# Patient Record
Sex: Male | Born: 1985 | Race: Black or African American | Hispanic: No | Marital: Single | Smoking: Current every day smoker
Health system: Southern US, Community
[De-identification: ages and names within clinical notes are randomized; demographics above are authoritative.]

## PROBLEM LIST (undated history)

## (undated) DIAGNOSIS — J45909 Unspecified asthma, uncomplicated: Secondary | ICD-10-CM

## (undated) HISTORY — PX: HERNIA REPAIR: SHX51

---
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
## 2007-01-09 ENCOUNTER — Emergency Department (HOSPITAL_COMMUNITY): Admission: EM | Admit: 2007-01-09 | Discharge: 2007-01-09 | Payer: Self-pay | Admitting: Emergency Medicine

## 2008-01-09 ENCOUNTER — Emergency Department (HOSPITAL_COMMUNITY): Admission: EM | Admit: 2008-01-09 | Discharge: 2008-01-10 | Payer: Self-pay | Admitting: Emergency Medicine

## 2008-09-10 ENCOUNTER — Emergency Department (HOSPITAL_COMMUNITY): Admission: EM | Admit: 2008-09-10 | Discharge: 2008-09-10 | Payer: Self-pay | Admitting: Emergency Medicine

## 2009-01-26 ENCOUNTER — Emergency Department (HOSPITAL_COMMUNITY): Admission: EM | Admit: 2009-01-26 | Discharge: 2009-01-26 | Payer: Self-pay | Admitting: Emergency Medicine

## 2010-01-31 ENCOUNTER — Ambulatory Visit: Payer: Self-pay | Admitting: Internal Medicine

## 2010-01-31 DIAGNOSIS — J45909 Unspecified asthma, uncomplicated: Secondary | ICD-10-CM | POA: Insufficient documentation

## 2010-01-31 LAB — CONVERTED CEMR LAB
ALT: 12 units/L (ref 0–53)
AST: 22 units/L (ref 0–37)
Alkaline Phosphatase: 65 units/L (ref 39–117)
Basophils Relative: 0.9 % (ref 0.0–3.0)
Bilirubin, Direct: 0.1 mg/dL (ref 0.0–0.3)
Calcium: 9.3 mg/dL (ref 8.4–10.5)
Chloride: 98 meq/L (ref 96–112)
Creatinine, Ser: 1.1 mg/dL (ref 0.4–1.5)
Eosinophils Relative: 3 % (ref 0.0–5.0)
GFR calc non Af Amer: 113.33 mL/min (ref >60)
Lymphocytes Relative: 30.8 % (ref 12.0–46.0)
MCV: 99.2 fL (ref 78.0–100.0)
Monocytes Relative: 5.4 % (ref 3.0–12.0)
Neutrophils Relative %: 59.9 % (ref 43.0–77.0)
Nitrite: NEGATIVE
Platelets: 273 10*3/uL (ref 150.0–400.0)
RBC: 4.67 M/uL (ref 4.22–5.81)
Specific Gravity, Urine: 1.025 (ref 1.000–1.030)
TSH: 1.12 microintl units/mL (ref 0.35–5.50)
Total Bilirubin: 0.9 mg/dL (ref 0.3–1.2)
Total CHOL/HDL Ratio: 4
Total Protein: 7.8 g/dL (ref 6.0–8.3)
Urobilinogen, UA: 1 (ref 0.0–1.0)
WBC: 5.5 10*3/uL (ref 4.5–10.5)
pH: 7 (ref 5.0–8.0)

## 2010-02-02 LAB — CONVERTED CEMR LAB
Chlamydia, Swab/Urine, PCR: POSITIVE — AB
GC Probe Amp, Urine: POSITIVE — AB

## 2010-08-06 NOTE — Assessment & Plan Note (Signed)
Summary: last seen 2007/united hc/cd   Vital Signs:  Patient profile:   25 year old male Height:      65 inches Weight:      137.50 pounds BMI:     22.96 O2 Sat:      96 % on Room air Temp:     98.7 degrees F oral Pulse rate:   61 / minute BP sitting:   100 / 62  (left arm) Cuff size:   regular  Vitals Entered By: Zella Ball Ewing CMA (AAMA) (January 31, 2010 3:11 PM)  O2 Flow:  Room air  CC: STD test/RE   CC:  STD test/RE.  History of Present Illness: here to re-establish; is sexually active, but has had some swelling near the glans penis and forskin;  small pain, some tingling to urination and not sure about penile d/c;  no fever or other GU complaint such as scrotal sweling or pain. Pt denies CP, sob, doe, wheezing, orthopnea, pnd, worsening LE edema, palps, dizziness or syncope  Pt denies new neuro symptoms such as headache, facial or extremity weakness   Preventive Screening-Counseling & Management  Alcohol-Tobacco     Smoking Status: current      Drug Use:  yes.    Problems Prior to Update: None  Medications Prior to Update: 1)  None  Current Medications (verified): 1)  Proair Hfa 108 (90 Base) Mcg/act Aers (Albuterol Sulfate) .... 2 Puffs Qid As Needed 2)  Azithromycin 250 Mg Tabs (Azithromycin) .... 4 Tabs By Mouth X 1 Dosing 3)  Ciprofloxacin Hcl 500 Mg Tabs (Ciprofloxacin Hcl) .Marland Kitchen.. 1 By Mouth Two Times A Day For One Day Only  Allergies (verified): No Known Drug Allergies  Past History:  Family History: Last updated: 01/31/2010 mother with recent lap band, HTN  Social History: Last updated: 01/31/2010 Single no children work - unemployed just moved from [place] Current Smoker Alcohol use-yes Drug use-yes - small marijuana  Risk Factors: Smoking Status: current (01/31/2010)  Past Medical History: Asthma  Past Surgical History: Inguinal herniorrhaphy  Family History: Reviewed history and no changes required. mother with recent lap band,  HTN  Social History: Reviewed history and no changes required. Single no children work - unemployed just moved from [place] Current Smoker Alcohol use-yes Drug use-yes - small marijuanaSmoking Status:  current Drug Use:  yes  Review of Systems  The patient denies anorexia, fever, weight loss, weight gain, vision loss, decreased hearing, hoarseness, chest pain, syncope, dyspnea on exertion, peripheral edema, prolonged cough, headaches, hemoptysis, abdominal pain, melena, hematochezia, severe indigestion/heartburn, hematuria, genital sores, muscle weakness, suspicious skin lesions, transient blindness, difficulty walking, depression, unusual weight change, abnormal bleeding, enlarged lymph nodes, and angioedema.         all otherwise negative per pt -    Physical Exam  General:  alert and well-developed.   Head:  normocephalic and atraumatic.   Eyes:  vision grossly intact, pupils equal, and pupils round.   Ears:  R ear normal and L ear normal.   Nose:  no external deformity and no nasal discharge.   Mouth:  no gingival abnormalities and pharynx pink and moist.   Neck:  supple and no masses.   Lungs:  normal respiratory effort and normal breath sounds.   Heart:  normal rate and regular rhythm.   Abdomen:  soft, non-tender, and normal bowel sounds.   Genitalia:  Testes bilaterally descended without nodularity, tenderness or masses. No scrotal masses or lesions. No penis lesions but does have  mild off-white small penile d/c Msk:  no joint tenderness and no joint swelling.   Extremities:  no edema, no erythema  Neurologic:  cranial nerves II-XII intact and strength normal in all extremities.     Impression & Recommendations:  Problem # 1:  Preventive Health Care (ICD-V70.0)  Overall doing well, age appropriate education and counseling updated and referral for appropriate preventive services done unless declined, immunizations up to date or declined, diet counseling done if  overweight, urged to quit smoking if smokes , most recent labs reviewed and current ordered if appropriate, ecg reviewed or declined (interpretation per ECG scanned in the EMR if done); information regarding Medicare Prevention requirements given if appropriate; speciality referrals updated as appropriate   Orders: TLB-BMP (Basic Metabolic Panel-BMET) (80048-METABOL) TLB-CBC Platelet - w/Differential (85025-CBCD) TLB-Hepatic/Liver Function Pnl (80076-HEPATIC) TLB-Lipid Panel (80061-LIPID) TLB-TSH (Thyroid Stimulating Hormone) (84443-TSH) TLB-Udip ONLY (81003-UDIP)  Problem # 2:  ASTHMA (ICD-493.90)  His updated medication list for this problem includes:    Proair Hfa 108 (90 Base) Mcg/act Aers (Albuterol sulfate) .Marland Kitchen... 2 puffs qid as needede  excercise related - treat as above, f/u any worsening signs or symptoms   Problem # 3:  SEXUALLY TRANSMITTED DISEASE, EXPOSURE TO (ICD-V01.6)  for eval - has penile d/c today;  ok to tx empirically for gc/chlamydia   Orders: T-Chlamydia & GC Probe, Urine (87491/[address]) T-HIV-1 (Screen) (78295) T-Syphilis Test (RPR) [phone]) T-Herpes Simplex Type 2 (46962-95284)  Complete Medication List: 1)  Proair Hfa 108 (90 Base) Mcg/act Aers (Albuterol sulfate) .... 2 puffs qid as needed 2)  Azithromycin 250 Mg Tabs (Azithromycin) .... 4 tabs by mouth x 1 dosing 3)  Ciprofloxacin Hcl 500 Mg Tabs (Ciprofloxacin hcl) .Marland Kitchen.. 1 by mouth two times a day for one day only  Patient Instructions: 1)  Please take all new medications as prescribed 2)  Continue all previous medications as before this visit  3)  Please go to the Lab in the basement for your blood and/or urine tests today 4)  Please schedule a follow-up appointment as needed. Prescriptions: CIPROFLOXACIN HCL 500 MG TABS (CIPROFLOXACIN HCL) 1 by mouth two times a day for one day only  #2 x 0   Entered and Authorized by:   Corwin Levins MD   Signed by:   Corwin Levins MD on 01/31/2010   Method  used:   Print then Give to Patient   RxID:   [id number] AZITHROMYCIN 250 MG TABS (AZITHROMYCIN) 4 tabs by mouth x 1 dosing  #4 x 0   Entered and Authorized by:   Corwin Levins MD   Signed by:   Corwin Levins MD on 01/31/2010   Method used:   Print then Give to Patient   RxID:   [id number] PROAIR HFA 108 (90 BASE) MCG/ACT AERS (ALBUTEROL SULFATE) 2 puffs qid as needed  #1 x 11   Entered and Authorized by:   Corwin Levins MD   Signed by:   Corwin Levins MD on 01/31/2010   Method used:   Print then Give to Patient   RxID:   [id number]    Immunization History:  Tetanus/Td Immunization History:    Tetanus/Td:  historical (01/04/2009)

## 2010-10-17 LAB — COMPREHENSIVE METABOLIC PANEL
Albumin: 4.6 g/dL (ref 3.5–5.2)
Alkaline Phosphatase: 71 U/L (ref 39–117)
BUN: 10 mg/dL (ref 6–23)
CO2: 28 mEq/L (ref 19–32)
Chloride: 106 mEq/L (ref 96–112)
Creatinine, Ser: 1.08 mg/dL (ref 0.4–1.5)
GFR calc non Af Amer: >60 mL/min (ref >60)
Glucose, Bld: 89 mg/dL (ref 70–99)
Potassium: 3.8 mEq/L (ref 3.5–5.1)
Total Bilirubin: 1 mg/dL (ref 0.3–1.2)

## 2010-10-17 LAB — POCT I-STAT, CHEM 8
Glucose, Bld: 84 mg/dL (ref 70–99)
HCT: 52 % (ref 39.0–52.0)
Hemoglobin: 17.7 g/dL — ABNORMAL HIGH (ref 13.0–17.0)
Potassium: 3.9 mEq/L (ref 3.5–5.1)

## 2010-10-17 LAB — URINALYSIS, ROUTINE W REFLEX MICROSCOPIC
Glucose, UA: NEGATIVE mg/dL
Nitrite: NEGATIVE
Protein, ur: 30 mg/dL — AB
pH: 6.5 (ref 5.0–8.0)

## 2010-10-17 LAB — DIFFERENTIAL
Basophils Absolute: 0 10*3/uL (ref 0.0–0.1)
Basophils Relative: 0 % (ref 0–1)
Lymphocytes Relative: 23 % (ref 12–46)
Monocytes Absolute: 0.5 10*3/uL (ref 0.1–1.0)
Neutro Abs: 2.8 10*3/uL (ref 1.7–7.7)
Neutrophils Relative %: 62 % (ref 43–77)

## 2010-10-17 LAB — CBC
HCT: 49.2 % (ref 39.0–52.0)
Hemoglobin: 17 g/dL (ref 13.0–17.0)
MCV: 97.3 fL (ref 78.0–100.0)
WBC: 4.5 10*3/uL (ref 4.0–10.5)

## 2010-10-17 LAB — LIPASE, BLOOD: Lipase: 16 U/L (ref 11–59)

## 2010-10-17 LAB — URINE MICROSCOPIC-ADD ON

## 2010-10-17 LAB — URINE CULTURE

## 2011-01-01 ENCOUNTER — Inpatient Hospital Stay (INDEPENDENT_AMBULATORY_CARE_PROVIDER_SITE_OTHER)
Admission: RE | Admit: 2011-01-01 | Discharge: 2011-01-01 | Disposition: A | Discharge status: Home / Self Care | Payer: Self-pay | Source: Ambulatory Visit | Attending: Emergency Medicine | Admitting: Emergency Medicine

## 2011-01-01 ENCOUNTER — Ambulatory Visit (INDEPENDENT_AMBULATORY_CARE_PROVIDER_SITE_OTHER): Payer: Self-pay

## 2011-01-01 DIAGNOSIS — M79609 Pain in unspecified limb: Secondary | ICD-10-CM

## 2011-04-22 LAB — RAPID STREP SCREEN (MED CTR MEBANE ONLY): Streptococcus, Group A Screen (Direct): NEGATIVE

## 2012-03-28 ENCOUNTER — Encounter (HOSPITAL_COMMUNITY): Payer: Self-pay | Admitting: Emergency Medicine

## 2012-03-28 ENCOUNTER — Emergency Department (HOSPITAL_COMMUNITY)
Admission: EM | Admit: 2012-03-28 | Discharge: 2012-03-28 | Disposition: A | Discharge status: Home / Self Care | Payer: Self-pay | Attending: Emergency Medicine | Admitting: Emergency Medicine

## 2012-03-28 ENCOUNTER — Emergency Department (HOSPITAL_COMMUNITY): Payer: Self-pay

## 2012-03-28 DIAGNOSIS — M7520 Bicipital tendinitis, unspecified shoulder: Secondary | ICD-10-CM | POA: Insufficient documentation

## 2012-03-28 DIAGNOSIS — M25519 Pain in unspecified shoulder: Secondary | ICD-10-CM

## 2012-03-28 DIAGNOSIS — F17200 Nicotine dependence, unspecified, uncomplicated: Secondary | ICD-10-CM | POA: Insufficient documentation

## 2012-03-28 NOTE — ED Notes (Signed)
Ortho tech at bedside for sling application. 

## 2012-03-28 NOTE — ED Provider Notes (Signed)
History     CSN: [id number]  Arrival date & time 03/28/12  1338   First MD Initiated Contact with Patient 03/28/12 1434      Chief Complaint  Patient presents with  . Shoulder Pain    r/shoulder pain x days.     (Consider location/radiation/quality/duration/timing/severity/associated sxs/prior treatment) HPI Comments: 26 year old male presents the emergency department with worsening right shoulder pain for the past 2 days. He states 2 weeks ago his right shoulder began to hurt at work while lifting heavy boxes. The pain lasted about 3 days, went away, and returned 2 days ago. He describes the pain as a dull throb, increasing to a sharp 7/10 pain with movement. He has not tried any alleviating factors. He has never injured this shoulder before. Admits to tingling into his bicep. Denies numbness.  Patient is a 26 y.o. male presenting with shoulder pain. The history is provided by the patient.  Shoulder Pain Associated symptoms include arthralgias ( right shoulder pain) and weakness. Pertinent negatives include no fever or numbness.    History reviewed. No pertinent past medical history.  Past Surgical History  Procedure Date  . Hernia repair     History reviewed. No pertinent family history.  History  Substance Use Topics  . Smoking status: Current Every Day Smoker    Types: Cigarettes  . Smokeless tobacco: Not on file  . Alcohol Use: Yes      Review of Systems  Constitutional: Negative for fever and activity change.  Musculoskeletal: Positive for arthralgias ( right shoulder pain).  Skin: Negative for color change.  Neurological: Positive for weakness. Negative for numbness.    Allergies  Review of patient's allergies indicates no known allergies.  Home Medications  No current outpatient prescriptions on file.  BP 112/71  Pulse 60  Temp 98.1 F (36.7 C) (Oral)  Resp 18  SpO2 99%  Physical Exam  Constitutional: He is oriented to person, place, and time. He  appears well-developed and well-nourished. No distress.  HENT:  Head: Normocephalic and atraumatic.  Mouth/Throat: Oropharynx is clear and moist.  Eyes: Conjunctivae normal are normal.  Neck: Normal range of motion. Neck supple.  Cardiovascular: Normal rate, regular rhythm, normal heart sounds and intact distal pulses.   Pulmonary/Chest: Effort normal and breath sounds normal.  Musculoskeletal:       Right shoulder: He exhibits decreased range of motion (in all directions due to pain), tenderness ( biceps tendon insertion), bony tenderness (acromion process) and decreased strength (due to pain). He exhibits no swelling, no deformity and normal pulse.  Neurological: He is alert and oriented to person, place, and time. No sensory deficit.  Skin: Skin is warm and dry. No erythema.  Psychiatric: He has a normal mood and affect. His behavior is normal.    ED Course  Procedures (including critical care time)  Labs Reviewed - No data to display Dg Shoulder Right  03/28/2012  *RADIOLOGY REPORT*  Clinical Data: Shoulder pain, no known injury  RIGHT SHOULDER - 2+ VIEW  Comparison: None.  Findings: Three views of the right shoulder submitted.  No acute fracture or subluxation.  AC joint and glenohumeral joint are preserved.  IMPRESSION: No acute fracture or subluxation.   Original Report Authenticated By: Natasha Mead, M.D.      1. Shoulder pain   2. Bicipital tendinitis       MDM  26 year old male with right shoulder pain. Exam consistent with bicipital tendinitis. I will give him a shoulder sling and followup  with or so. Advised ibuprofen every 4-6 hours to avoid heavy lifting.        Trevor Mace, PA-C 03/28/12 1540

## 2012-03-28 NOTE — ED Notes (Signed)
Pt reports r/shoulder pain x 2 days.Marland Kitchen Hx of same. Denies trauma. Has been doing heavy lifting

## 2012-03-30 NOTE — ED Provider Notes (Signed)
Medical screening examination/treatment/procedure(s) were performed by non-physician practitioner and as supervising physician I was immediately available for consultation/collaboration.  Flint Melter, MD 03/30/12 2042

## 2013-02-09 ENCOUNTER — Emergency Department (HOSPITAL_COMMUNITY)
Admission: EM | Admit: 2013-02-09 | Discharge: 2013-02-09 | Disposition: A | Discharge status: Home / Self Care | Payer: No Typology Code available for payment source | Attending: Emergency Medicine | Admitting: Emergency Medicine

## 2013-02-09 ENCOUNTER — Emergency Department (HOSPITAL_COMMUNITY): Payer: No Typology Code available for payment source

## 2013-02-09 ENCOUNTER — Encounter (HOSPITAL_COMMUNITY): Payer: Self-pay | Admitting: Emergency Medicine

## 2013-02-09 DIAGNOSIS — X500XXA Overexertion from strenuous movement or load, initial encounter: Secondary | ICD-10-CM | POA: Insufficient documentation

## 2013-02-09 DIAGNOSIS — Y929 Unspecified place or not applicable: Secondary | ICD-10-CM | POA: Insufficient documentation

## 2013-02-09 DIAGNOSIS — F17200 Nicotine dependence, unspecified, uncomplicated: Secondary | ICD-10-CM | POA: Insufficient documentation

## 2013-02-09 DIAGNOSIS — R209 Unspecified disturbances of skin sensation: Secondary | ICD-10-CM | POA: Insufficient documentation

## 2013-02-09 DIAGNOSIS — Z79899 Other long term (current) drug therapy: Secondary | ICD-10-CM | POA: Insufficient documentation

## 2013-02-09 DIAGNOSIS — S6990XA Unspecified injury of unspecified wrist, hand and finger(s), initial encounter: Secondary | ICD-10-CM | POA: Insufficient documentation

## 2013-02-09 DIAGNOSIS — S59909A Unspecified injury of unspecified elbow, initial encounter: Secondary | ICD-10-CM | POA: Insufficient documentation

## 2013-02-09 DIAGNOSIS — Y9389 Activity, other specified: Secondary | ICD-10-CM | POA: Insufficient documentation

## 2013-02-09 DIAGNOSIS — M25531 Pain in right wrist: Secondary | ICD-10-CM

## 2013-02-09 MED ORDER — NAPROXEN 500 MG PO TABS: 500.0000 mg | ORAL_TABLET | Freq: Two times a day (BID) | ORAL | Status: DC

## 2013-02-09 NOTE — Progress Notes (Signed)
[facility] provided patient with a list of primary care resources.  °

## 2013-02-09 NOTE — Progress Notes (Signed)
During pt's 02/09/13 [facility] ED visit CM spoke with him to confirm no pcp. Pt stated he has a new job and coverage but does not have a card CM reviewed with pt the importance of contacting his HR Probation officer) staff to get his policy number and customer service number to assist him to re contact ED registration to apply this coverage to his visit today  [facility] ED CM spoke with pt on how to obtain an in network pcp with insurance coverage via the customer service number or web site

## 2013-02-09 NOTE — ED Provider Notes (Signed)
CSN: [id number]     Arrival date & time 02/09/13  1118 History     First MD Initiated Contact with Patient 02/09/13 1132     Chief Complaint  Patient presents with  . right hand injury    (Consider location/radiation/quality/duration/timing/severity/associated sxs/prior Treatment) The history is provided by the patient. No language interpreter was used.  Randall Dorsey is a 27 y/o M presenting to the ED with right hand pain that started on Friday, 02/04/2013, after patient pushed a steel door. Patient reported that he noticed swelling to the right hand immediately after the event, stated that the swelling has gone down some. Reported that he has pain whenever he closes his hand to create a fist, reported sharp pain that begins from the ulnar aspect of the right wrist to the right long finger. Patient reported that there is no pain when the hand is at rest. Stated that he has been applying ice, icy-hot ointment use, and been wrapping the hand. Reported that he has mild numbness to the right hand. Denied tingling, loss of sensation, ecchymosis, decreased grip, weakness.  PCP Dr. Oliver Dorsey  History reviewed. No pertinent past medical history. Past Surgical History  Procedure Laterality Date  . Hernia repair     No family history on file. History  Substance Use Topics  . Smoking status: Current Every Day Smoker    Types: Cigarettes  . Smokeless tobacco: Not on file  . Alcohol Use: Yes    Review of Systems  Constitutional: Negative for fever and chills.  Respiratory: Negative for chest tightness and shortness of breath.   Cardiovascular: Negative for chest pain.  Musculoskeletal: Positive for arthralgias (right hand).  Neurological: Positive for numbness (mild). Negative for dizziness and weakness.  All other systems reviewed and are negative.    Allergies  Review of patient's allergies indicates no known allergies.  Home Medications   Current Outpatient Rx  Name  Route  Sig   Dispense  Refill  . naproxen (NAPROSYN) 500 MG tablet   Oral   Take 1 tablet (500 mg total) by mouth 2 (two) times daily.   30 tablet   0    BP 128/80  Pulse 53  Temp(Src) 97.9 F (36.6 C) (Oral)  Resp 17  SpO2 100% Physical Exam  Nursing note and vitals reviewed. Constitutional: He is oriented to person, place, and time. He appears well-developed and well-nourished. No distress.  HENT:  Head: Normocephalic and atraumatic.  Neck: Normal range of motion. Neck supple.  Cardiovascular: Normal rate, regular rhythm and normal heart sounds.  Exam reveals no friction rub.   No murmur heard. Pulses:      Radial pulses are 2+ on the right side, and 2+ on the left side.  Pulmonary/Chest: Effort normal and breath sounds normal. No respiratory distress. He has no wheezes. He has no rales.  Musculoskeletal: Normal range of motion.  Mild swelling noted to the the thenar and hypothenar region of the right hand Negative erythema, inflammation, ecchymosis, warmth, lesions, abrasions, punctures noted  Negative snuffbox tenderness to the right hand Discomfort upon palpation to the right long finger and right wrist circumferential  Full flexion, extension, inversion, eversion, supination and pronation of right wrist Full adduction and abduction of the right digits Patient able to make a fist   Neurological: He is alert and oriented to person, place, and time. He exhibits normal muscle tone. Coordination normal.  Strength 5+/5+ to right wrist and digits Sensation intact to RUE and  LUE with differentiation tot sharp and dull touch  Skin: Skin is warm and dry. No rash noted. He is not diaphoretic. No erythema.  Psychiatric: He has a normal mood and affect. His behavior is normal. Thought content normal.    ED Course   Procedures (including critical care time)  Labs Reviewed - No data to display Dg Wrist Complete Right  02/09/2013   *RADIOLOGY REPORT*  Clinical Data: Hand injury complaining  of wrist pain.  RIGHT WRIST - COMPLETE 3+ VIEW  Comparison: No priors.  Findings: Four views of the right wrist demonstrate no acute displaced fracture, subluxation, dislocation, joint or soft tissue abnormality.  IMPRESSION: 1.  No acute radiographic abnormality of the right wrist.   Original Report Authenticated By: Trudie Reed, M.D.   Dg Hand Complete Right  02/09/2013   *RADIOLOGY REPORT*  Clinical Data: Punched a door with hand and wrist pain diffusely  RIGHT HAND - COMPLETE 3+ VIEW  Comparison: None.  Findings: No acute fracture is seen.  Radiocarpal joint space appears normal and the carpal bones are in normal position.  MCP, PIP, and DIP joints appear normal.  IMPRESSION: Negative.   Original Report Authenticated By: Dwyane Dee, M.D.   1. Right wrist pain     MDM  Patient presenting to the ED with right hand pain after punching a steel door on 02/04/2013. Mild swelling noted to the right thenar and hypothenar region. Full ROM to the right wrist and digits. Sensation intact. Strength intact. Pulses palpable. Negative neurological deficits noted. Negative deformities noted. Imaging negative for acute fractures or dislocations.  Patient placed in wrist splint. Negative signs of septic joint. Negative fracture. Suspicion to be possible bone contusion secondary to trauma. Patient stable, afebrile. Discharged patient. Discharged with anti-inflammatories. Discussed with patient to rest, ice, elevate. Referred to PCP and orthopedics. Discussed with patient to continue to monitor symptoms and if symptoms are to worsen or change to report back to the ED - strict return instructions given.  Patient agreed to plan of care, understood, all questions answered.   Raymon Mutton, PA-C 02/09/13 2356

## 2013-02-09 NOTE — ED Notes (Signed)
Pt states he got mad on Friday and to his anger out on the wrong object. Pt right hand is swollen, painful and cant grip his screwdriver that he has to use while at work.

## 2013-02-11 NOTE — ED Provider Notes (Signed)
History/physical exam/procedure(s) were performed by non-physician practitioner and as supervising physician I was immediately available for consultation/collaboration. I have reviewed all notes and am in agreement with care and plan.   Hilario Quarry, MD 02/11/13 2156

## 2014-01-30 ENCOUNTER — Encounter (HOSPITAL_COMMUNITY): Payer: Self-pay | Admitting: Emergency Medicine

## 2014-01-30 ENCOUNTER — Emergency Department (HOSPITAL_COMMUNITY)
Admission: EM | Admit: 2014-01-30 | Discharge: 2014-01-31 | Disposition: A | Discharge status: Home / Self Care | Payer: No Typology Code available for payment source | Attending: Emergency Medicine | Admitting: Emergency Medicine

## 2014-01-30 DIAGNOSIS — F17200 Nicotine dependence, unspecified, uncomplicated: Secondary | ICD-10-CM | POA: Insufficient documentation

## 2014-01-30 DIAGNOSIS — Y9389 Activity, other specified: Secondary | ICD-10-CM | POA: Insufficient documentation

## 2014-01-30 DIAGNOSIS — Y9289 Other specified places as the place of occurrence of the external cause: Secondary | ICD-10-CM | POA: Insufficient documentation

## 2014-01-30 DIAGNOSIS — S61209A Unspecified open wound of unspecified finger without damage to nail, initial encounter: Secondary | ICD-10-CM | POA: Insufficient documentation

## 2014-01-30 DIAGNOSIS — IMO0002 Reserved for concepts with insufficient information to code with codable children: Secondary | ICD-10-CM | POA: Insufficient documentation

## 2014-01-30 DIAGNOSIS — S61219A Laceration without foreign body of unspecified finger without damage to nail, initial encounter: Secondary | ICD-10-CM

## 2014-01-30 DIAGNOSIS — S6990XA Unspecified injury of unspecified wrist, hand and finger(s), initial encounter: Secondary | ICD-10-CM | POA: Insufficient documentation

## 2014-01-30 NOTE — ED Notes (Signed)
Pt c/o lac to knuckle on  R index finger. Pt states he hit his finger on the steering wheel. Pt states his tetanus shot is up to date.

## 2014-01-30 NOTE — ED Notes (Signed)
Patient called to triage, no answer 

## 2014-01-31 ENCOUNTER — Emergency Department (HOSPITAL_COMMUNITY): Payer: No Typology Code available for payment source

## 2014-01-31 NOTE — ED Provider Notes (Signed)
CSN: [id number]     Arrival date & time 01/30/14  2256 History   First MD Initiated Contact with Patient 01/30/14 2354     Chief Complaint  Patient presents with  . Hand Injury     (Consider location/radiation/quality/duration/timing/severity/associated sxs/prior Treatment) HPI Comments: Patient presenting with right hand pain.  He reports that he hit his steering wheel in anger just prior to arrival.  He sustained a small laceration to the PIP of the right index finger at that time.  He reports that he has not noticed any swelling of the area.  Bleeding is controlled.  He is able to move the finger, but has some increased pain with movement and palpation.  He denies numbness or tingling.  He has not taken anything for pain prior to arrival.  He reports that his Tetanus is UTD.  The history is provided by the patient.    History reviewed. No pertinent past medical history. Past Surgical History  Procedure Laterality Date  . Hernia repair     No family history on file. History  Substance Use Topics  . Smoking status: Current Every Day Smoker    Types: Cigarettes  . Smokeless tobacco: Not on file  . Alcohol Use: Yes    Review of Systems  Musculoskeletal:       Hand pain  Skin: Positive for wound.      Allergies  Review of patient's allergies indicates no known allergies.  Home Medications   Prior to Admission medications   Not on File   BP 122/85  Pulse 49  Temp(Src) 98.2 F (36.8 C) (Oral)  Resp 16  SpO2 100% Physical Exam  Nursing note and vitals reviewed. Constitutional: He appears well-developed and well-nourished.  HENT:  Head: Normocephalic and atraumatic.  Mouth/Throat: Oropharynx is clear and moist.  Cardiovascular: Normal rate, regular rhythm and normal heart sounds.   Pulses:      Radial pulses are 2+ on the right side, and 2+ on the left side.  Pulmonary/Chest: Effort normal and breath sounds normal.  Musculoskeletal: Normal range of motion.    Right wrist: He exhibits normal range of motion, no bony tenderness and no swelling.  Patient with full ROM of all fingers of the right hand, but increased pain with ROM of the right index finger at the MCP and the PIP.  No swelling or bruising of the hand.  Patient able to make fist.    Neurological: He is alert.  Distal sensation of all fingers of the right hand intact  Skin: Skin is warm and dry.  Small superficial non gaping 1 cm linear laceration of the dorsal aspect of the PIP of the right index finger.    Psychiatric: He has a normal mood and affect.    ED Course  Procedures (including critical care time) Labs Review Labs Reviewed - No data to display  Imaging Review Dg Hand Complete Right  01/31/2014   CLINICAL DATA:  Right hand pain after injury.  EXAM: RIGHT HAND - COMPLETE 3+ VIEW  COMPARISON:  02/09/2013  FINDINGS: There is no evidence of fracture or dislocation. There is no evidence of arthropathy or other focal bone abnormality. Soft tissues are unremarkable.  IMPRESSION: Negative.   Electronically Signed   By: Burman Nieves M.D.   On: 01/31/2014 00:45     EKG Interpretation None     LACERATION REPAIR Performed by: Santiago Glad Authorized by: Santiago Glad Consent: Verbal consent obtained. Risks and benefits: risks, benefits and alternatives  were discussed Consent given by: patient Patient identity confirmed: provided demographic data Prepped and Draped in normal sterile fashion Wound explored  Laceration Location: right index finger over the PIP  Laceration Length: 1 cm  No Foreign Bodies seen or palpated  Anesthetic total: None  Irrigation method: syringe Amount of cleaning: standard  Skin closure: Dermabond  Technique: Dermabond  Patient tolerance: Patient tolerated the procedure well with no immediate complications.  MDM   Final diagnoses:  None   Patient presenting with right hand pain after hitting his steering wheel in anger.   Xray negative.  Patient is neurovascularly intact.  He did sustain a small superficial laceration over the PIP.  Laceration repaired with Dermabond.  Patient is stable for discharge.  Return precautions given.      Santiago GladHeather Zyion Leidner, PA-C 02/01/14 1411

## 2014-02-08 NOTE — ED Provider Notes (Signed)
Medical screening examination/treatment/procedure(s) were performed by non-physician practitioner and as supervising physician I was immediately available for consultation/collaboration.   EKG Interpretation None       Derwood KaplanAnkit Zohair Epp, MD 02/08/14 [phone]

## 2015-02-04 ENCOUNTER — Encounter (HOSPITAL_COMMUNITY): Payer: Self-pay | Admitting: *Deleted

## 2015-02-04 ENCOUNTER — Emergency Department (HOSPITAL_COMMUNITY)
Admission: EM | Admit: 2015-02-04 | Discharge: 2015-02-04 | Disposition: A | Discharge status: Home / Self Care | Payer: Self-pay | Attending: Emergency Medicine | Admitting: Emergency Medicine

## 2015-02-04 DIAGNOSIS — M545 Low back pain, unspecified: Secondary | ICD-10-CM

## 2015-02-04 DIAGNOSIS — Z720 Tobacco use: Secondary | ICD-10-CM | POA: Insufficient documentation

## 2015-02-04 MED ORDER — CYCLOBENZAPRINE HCL 10 MG PO TABS: 10.0000 mg | ORAL_TABLET | Freq: Two times a day (BID) | ORAL | Status: DC | PRN

## 2015-02-04 MED ORDER — HYDROCODONE-ACETAMINOPHEN 5-325 MG PO TABS: 1.0000 | ORAL_TABLET | Freq: Four times a day (QID) | ORAL | Status: DC | PRN

## 2015-02-04 NOTE — Discharge Instructions (Signed)
Back Exercises Back exercises help treat and prevent back injuries. The goal of back exercises is to increase the strength of your abdominal and back muscles and the flexibility of your back. These exercises should be started when you no longer have back pain. Back exercises include:  Pelvic Tilt. Lie on your back with your knees bent. Tilt your pelvis until the lower part of your back is against the floor. Hold this position 5 to 10 sec and repeat 5 to 10 times.  Knee to Chest. Pull first 1 knee up against your chest and hold for 20 to 30 seconds, repeat this with the other knee, and then both knees. This may be done with the other leg straight or bent, whichever feels better.  Sit-Ups or Curl-Ups. Bend your knees 90 degrees. Start with tilting your pelvis, and do a partial, slow sit-up, lifting your trunk only 30 to 45 degrees off the floor. Take at least 2 to 3 seconds for each sit-up. Do not do sit-ups with your knees out straight. If partial sit-ups are difficult, simply do the above but with only tightening your abdominal muscles and holding it as directed.  Hip-Lift. Lie on your back with your knees flexed 90 degrees. Push down with your feet and shoulders as you raise your hips a couple inches off the floor; hold for 10 seconds, repeat 5 to 10 times.  Back arches. Lie on your stomach, propping yourself up on bent elbows. Slowly press on your hands, causing an arch in your low back. Repeat 3 to 5 times. Any initial stiffness and discomfort should lessen with repetition over time.  Shoulder-Lifts. Lie face down with arms beside your body. Keep hips and torso pressed to floor as you slowly lift your head and shoulders off the floor. Do not overdo your exercises, especially in the beginning. Exercises may cause you some mild back discomfort which lasts for a few minutes; however, if the pain is more severe, or lasts for more than 15 minutes, do not continue exercises until you see your caregiver.  Improvement with exercise therapy for back problems is slow.  See your caregivers for assistance with developing a proper back exercise program. Document Released: 07/31/2004 Document Revised: 09/15/2011 Document Reviewed: 04/24/2011 [facility] Patient Information 2015 [place], [place]. This information is not intended to replace advice given to you by your health care provider. Make sure you discuss any questions you have with your health care provider.  Please use medication only as directed, monitor for new or worsening signs or symptoms return if any present. Please follow-up your primary care provider for further evaluation and management.

## 2015-02-04 NOTE — ED Notes (Signed)
Pt reports back pain started on Tuesday 01-30-15. Pt has been taking tylenol for pain with no relief.

## 2015-02-04 NOTE — ED Provider Notes (Signed)
CSN: [id number]     Arrival date & time 02/04/15  1019 History  This chart was scribed for non-physician practitioner Jeralyn Bennett, PA-C working with Elwin Mocha, MD by Lyndel Safe, ED Scribe. This patient was seen in room TR11C/TR11C and the patient's care was started at 10:54 AM.       Chief Complaint  Patient presents with  . Back Pain   The history is provided by the patient. No language interpreter was used.   HPI Comments: Randall Dorsey is a 29 y.o. male who presents to the Emergency Department complaining of gradually worsening, constant, sharp lower back pain with onset 5 days. He reports the pain is more prominent on the lower right side. Pt reports he felt his back tighten 6 days ago after he tried stretching his lower back with onset of the pain after waking up the next morning from his stretch. His pain is exacerbated with ambulation, bending, or sitting down. The pt has tried applying heating pads and taking tylenol with no relief. 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 Denies any injury or heavy lifting that is attributable to the pain, numbness or tingling down his legs, decreased strength in his legs, fevers, numbness or tingling in his groin region, bladder or bowel incontinence, or use of IV drugs. Pt is not followed by a PCP but states he recently re-applied for insurance through his employer and can establish care following insurance coverage.   History reviewed. No pertinent past medical history. Past Surgical History  Procedure Laterality Date  . Hernia repair     History reviewed. No pertinent family history. History  Substance Use Topics  . Smoking status: Current Every Day Smoker    Types: Cigarettes  . Smokeless tobacco: Not on file  . Alcohol Use: Yes    Review of Systems  All other systems reviewed and are negative.  Allergies  Review of patient's allergies indicates no known allergies.  Home  Medications   Prior to Admission medications   Medication Sig Start Date End Date Taking? Authorizing Provider  cyclobenzaprine (FLEXERIL) 10 MG tablet Take 1 tablet (10 mg total) by mouth 2 (two) times daily as needed for muscle spasms. 02/04/15   Eyvonne Mechanic, PA-C  HYDROcodone-acetaminophen (NORCO/VICODIN) 5-325 MG per tablet Take 1 tablet by mouth every 6 (six) hours as needed. 02/04/15   Tayvon Culley, PA-C   BP 131/73 mmHg  Pulse 83  Temp(Src) 97.6 F (36.4 C) (Oral)  Resp 12  Ht 5\' 5"  (1.651 m)  Wt 145 lb (65.772 kg)  BMI 24.13 kg/m2  SpO2 100% Physical Exam  Constitutional: He appears well-developed and well-nourished. No distress.  HENT:  Head: Normocephalic.  Eyes: Conjunctivae are normal. Right eye exhibits no discharge. Left eye exhibits no discharge. No scleral icterus.  Neck: No JVD present.  Pulmonary/Chest: Effort normal. No respiratory distress.  Neurological: He is alert. Coordination normal.  Skin: Skin is warm. No rash noted. No erythema. No pallor.  Psychiatric: He has a normal mood and affect. His behavior is normal.  Nursing note and vitals reviewed.   ED Course  Procedures  Labs Review Labs Reviewed - No data to display  Imaging Review No results found.   EKG Interpretation None      MDM   Final diagnoses:  Right-sided low back pain without sciatica    Labs: N/A  Imaging: N/A  Consults:N/A  Therapeutics: N/A  Discharge Meds: hydrocodone-acetaminophen 5-325mg  and  flexeril    Assessment/Plan: Patient presents with back pain, no red flags. Likely muscular in nature. Advised pt to take ibuprofen and apply ice to the affected area for the next couple of days and then apply heat. Will prescribe muscle relaxant and short course of pain medication. Provided pt with stretching exercises to perform. Pt acknowledges and agrees to plan. Trichomoniasis return precautions given, follow-up with primary care provider for further evaluation and  management.   I personally performed the services described in this documentation, which was scribed in my presence. The recorded information has been reviewed and is accurate.   Eyvonne Mechanic, PA-C 02/04/15 1717  Elwin Mocha, MD 02/05/15 [phone]

## 2015-02-04 NOTE — ED Notes (Signed)
Declined W/C at D/C and was escorted to lobby by RN. 

## 2015-07-23 ENCOUNTER — Emergency Department (HOSPITAL_COMMUNITY)
Admission: EM | Admit: 2015-07-23 | Discharge: 2015-07-23 | Disposition: A | Discharge status: Home / Self Care | Payer: Self-pay | Attending: Emergency Medicine | Admitting: Emergency Medicine

## 2015-07-23 ENCOUNTER — Encounter (HOSPITAL_COMMUNITY): Payer: Self-pay | Admitting: *Deleted

## 2015-07-23 DIAGNOSIS — L02411 Cutaneous abscess of right axilla: Secondary | ICD-10-CM | POA: Insufficient documentation

## 2015-07-23 DIAGNOSIS — F17210 Nicotine dependence, cigarettes, uncomplicated: Secondary | ICD-10-CM | POA: Insufficient documentation

## 2015-07-23 DIAGNOSIS — L0291 Cutaneous abscess, unspecified: Secondary | ICD-10-CM

## 2015-07-23 MED ORDER — HYDROCODONE-ACETAMINOPHEN 5-325 MG PO TABS
1.0000 | ORAL_TABLET | Freq: Once | ORAL | Status: AC
  Administered 2015-07-23: 1 via ORAL
  Filled 2015-07-23: qty 1

## 2015-07-23 MED ORDER — SULFAMETHOXAZOLE-TRIMETHOPRIM 800-160 MG PO TABS
1.0000 | ORAL_TABLET | Freq: Once | ORAL | Status: AC
  Administered 2015-07-23: 1 via ORAL
  Filled 2015-07-23: qty 1

## 2015-07-23 MED ORDER — SULFAMETHOXAZOLE-TRIMETHOPRIM 800-160 MG PO TABS: 1.0000 | ORAL_TABLET | Freq: Two times a day (BID) | ORAL | Status: AC

## 2015-07-23 MED ORDER — HYDROCODONE-ACETAMINOPHEN 5-325 MG PO TABS: ORAL_TABLET | ORAL | Status: DC

## 2015-07-23 MED ORDER — LIDOCAINE HCL (PF) 1 % IJ SOLN
5.0000 mL | Freq: Once | INTRAMUSCULAR | Status: AC
  Administered 2015-07-23: 5 mL via INTRADERMAL
  Filled 2015-07-23: qty 5

## 2015-07-23 NOTE — Discharge Instructions (Signed)
Take vicodin for breakthrough pain, do not drink alcohol, drive, care for children or do other critical tasks while taking vicodin.   Take your antibiotics as directed and to completion. You should never have any leftover antibiotics! Push fluids and stay well hydrated.    If you develop fever, nausea, vomiting, worsening pain return to the emergency room immediately for recheck.  Do not hesitate to return to the emergency room for any new, worsening or concerning symptoms.  Please obtain primary care using resource guide below. Let them know that you were seen in the emergency room and that they will need to obtain records for further outpatient management.   Incision and Drainage Incision and drainage is a procedure in which a sac-like structure (cystic structure) is opened and drained. The area to be drained usually contains material such as pus, fluid, or blood.  LET YOUR CAREGIVER KNOW ABOUT:   Allergies to medicine.  Medicines taken, including vitamins, herbs, eyedrops, over-the-counter medicines, and creams.  Use of steroids (by mouth or creams).  Previous problems with anesthetics or numbing medicines.  History of bleeding problems or blood clots.  Previous surgery.  Other health problems, including diabetes and kidney problems.  Possibility of pregnancy, if this applies. RISKS AND COMPLICATIONS  Pain.  Bleeding.  Scarring.  Infection. BEFORE THE PROCEDURE  You may need to have an ultrasound or other imaging tests to see how large or deep your cystic structure is. Blood tests may also be used to determine if you have an infection or how severe the infection is. You may need to have a tetanus shot. PROCEDURE  The affected area is cleaned with a cleaning fluid. The cyst area will then be numbed with a medicine (local anesthetic). A small incision will be made in the cystic structure. A syringe or catheter may be used to drain the contents of the cystic structure, or  the contents may be squeezed out. The area will then be flushed with a cleansing solution. After cleansing the area, it is often gently packed with a gauze or another wound dressing. Once it is packed, it will be covered with gauze and tape or some other type of wound dressing. AFTER THE PROCEDURE   Often, you will be allowed to go home right after the procedure.  You may be given antibiotic medicine to prevent or heal an infection.  If the area was packed with gauze or some other wound dressing, you will likely need to come back in 1 to 2 days to get it removed.  The area should heal in about 14 days.   This information is not intended to replace advice given to you by your health care provider. Make sure you discuss any questions you have with your health care provider.   Document Released: 12/17/2000 Document Revised: 12/23/2011 Document Reviewed: 08/18/2011 Elsevier Interactive Patient Education [employer]   Emergency Department Resource Guide 1) Find a Doctor and Pay Out of Pocket Although you won't have to find out who is covered by your insurance plan, it is a good idea to ask around and get recommendations. You will then need to call the office and see if the doctor you have chosen will accept you as a new patient and what types of options they offer for patients who are self-pay. Some doctors offer discounts or will set up payment plans for their patients who do not have insurance, but you will need to ask so you aren't surprised when you get  to your appointment.  2) Contact Your Local Health Department Not all health departments have doctors that can see patients for sick visits, but many do, so it is worth a call to see if yours does. If you don't know where your local health department is, you can check in your phone book. The CDC also has a tool to help you locate your state's health department, and many state websites also have listings of all of their local health  departments.  3) Find a Walk-in Clinic If your illness is not likely to be very severe or complicated, you may want to try a walk in clinic. These are popping up all over the country in pharmacies, drugstores, and shopping centers. They're usually staffed by nurse practitioners or physician assistants that have been trained to treat common illnesses and complaints. They're usually fairly quick and inexpensive. However, if you have serious medical issues or chronic medical problems, these are probably not your best option.  No Primary Care Doctor: - Call Health Connect at  [phone] - they can help you locate a primary care doctor that  accepts your insurance, provides certain services, etc. - Physician Referral Service- [phone]  Chronic Pain Problems: Organization         Address  Phone   Notes  [facility]  [phone] Patients need to be referred by their primary care doctor.   Medication Assistance: Organization         Address  Phone   Notes  [facility] Medication [facility] [address] [place] [address] [place], [place] [address] [phone]) [phone] --Must be a resident of [facility] [place] -- Must have NO insurance coverage whatsoever (no Medicaid/ Medicare, etc.) -- The pt. MUST have a primary care doctor that directs their care regularly and follows them in the community   MedAssist  [phone]   [employer]  [phone]    Agencies that provide inexpensive medical care: Organization         Address  Phone   Notes  [facility]  [phone]   [facility]    [phone]   [facility] [address] [place], [place] [address] [phone]   [facility] [address], [place] [phone]) [phone]   Planned Parenthood    [phone](336) [phone]   [facility]    [phone]   [facility]  [address], [place] Phone:  [phone](336)  [phone], Fax:  [phone](336) [phone] Hours of Operation:  9 am - 6 pm, M-F.  Also accepts Medicaid/Medicare and self-pay.  [facility]  [address], [place] Phone: [phone](336) [phone], Fax: [phone]) [phone]. Hours of Operation:  8:30 am - 5:30 pm, M-F.  Also accepts Medicaid and self-pay.  [facility] [place] [address], [place] Phone: [phone]) [phone]   [facility] [address], [place], [place] [phone], Ext. [phone] Mondays & Thursdays: 7-9 AM.  First 15 patients are seen on a first come, first serve basis.    Medicaid-accepting [facility] Providers:  Organization         Address  Phone   Notes  [facility] [address], [place] [phone] Also accepts self-pay patients.  [facility] [address], [place]  [phone]) [phone]   [facility] [phone]  [address], [place] [phone]) [phone]   [facility] [address] [place] [address], [place] [phone]) [phone]   Renaye RakersVeita Bland [address], [place]   [phone] Only accepts [place] Access [place] patients after they have their name applied to their card.   Self-Pay (no insurance) in [facility] [place]:  Organization         Address  Phone   Notes  Sickle Cell Patients, [facility] [address], [place] [phone]) [phone]   [facility] [address], [place] [phone]   [facility] [place]  [address], [place] [phone]) [phone]   [facility]/Dr. Osei-Bonsu  [address] [place] [address], [place] or [phone], [place] [phone] Phone number for both [place] and [place] locations is the same.  [facility] [address], [place] [phone]) [phone]   [facility] [place] [address] [place] [address], [place] or [address] [phone](336)  [phone]   [facility] [address], [place] [phone], phone; [phone]) [phone], fax Sees patients 1st and 3rd Saturday of every month.  Must not qualify for public or private insurance (i.e. Medicaid, Medicare, [place] Health Choice, Veterans' Benefits)  Household income should be no more than 200% of the poverty level The clinic cannot treat you if you are pregnant or think you are pregnant  Sexually transmitted diseases are not treated at the clinic.    Dental Care: Organization         Address  Phone  Notes  [facility] of [facility] [address], [place] [phone] Accepts children up to age 28 who are enrolled in [place] or [place] Health Choice; pregnant women with a Medicaid card; and children who have applied for Medicaid or [place] Health Choice, but were declined, whose parents can pay a reduced fee at time of service.  [facility] Department of [facility] [place]  [address], [place] [phone] Accepts children up to age 28 who are enrolled in [place] or [place] Health Choice; pregnant women with a Medicaid card; and children who have applied for Medicaid or [place] Health Choice, but were declined, whose parents can pay a reduced fee at time of service.  [facility]  [address], [place] [phone] Patients are seen by appointment only. Walk-ins are not accepted. [facility] will see patients 28 years of age and older. Monday - Tuesday (8am-5pm) Most Wednesdays (8:30-5pm) $30 per visit, cash only  [facility]  [address], [facility] [phone] Patients are seen by appointment only. Walk-ins are not accepted. [facility] will see patients 28 years of age and older. One Wednesday Evening (Monthly: Volunteer Based).  $30 per visit, cash only  [facility]  [phone] for adults;  Children under age 594, call Graduate Pediatric Dentistry at [phone]. Children aged 454-14, please call [phone] to request a pediatric application.  Dental services are provided in all areas of dental care including fillings, crowns and bridges, complete and partial dentures, implants, gum treatment, root canals, and extractions. Preventive care is also provided. Treatment is provided to both adults and children. Patients are selected via a lottery and there is often a waiting list.   [facility]  [address], [place] Otto  [phone] [email or web address]   [facility] [address] [place], [place] [phone], Ext. [phone] Second and Fourth Thursday of each month, opens at 6:30 AM; Clinic ends at 9 AM.  Patients are seen on a first-come first-served basis, and a limited number are seen during each clinic.   [facility]  [address] [place], [place] [phone]   Eligibility Requirements You must have lived in [place], [place], or [place] counties for at least the last three months.   You cannot be eligible for state or federal sponsored [place], including [employer], [place], or [employer].   You generally cannot be eligible for healthcare insurance through your employer.    How to apply: Eligibility screenings are held every Tuesday and Wednesday afternoon from 1:00 pm until 4:00 pm. You do not need an appointment for the interview!  [facility] [address], [place], [place] [phone]   [facility] Health Department  [phone]   [facility] Health Department  [phone]   [facility] Health Department  [phone]    Behavioral Health Resources in the Community: Intensive Outpatient Programs Organization         Address  Phone  Notes  [facility] [address] [place], [place] [phone]   [facility] [address] [place], [place] [phone]   [facility] [address], [place], [place]  [phone]   [facility] Mental Health [address],  [place], [place] [phone] or [phone]   Substance Abuse Resources Organization         Address  Phone  Notes  [facility] Drug Services  [phone]   [facility]  [phone]   [facility]  [phone]   Floydene Flock  [phone]   Residential & Outpatient Substance Abuse Program  [phone]   Psychological Services Organization         Address  Phone  Notes  [facility] Behavioral Health  [phone]   [facility]  [phone]   [facility] Mental Health [address], [place] [phone] or [phone]    Mobile Crisis Teams Organization         Address  Phone  Notes  [facility], Mobile Crisis Care Unit  [phone]   [facility]  [address] [place], [place] [phone]   Doristine Locks [address] [place] [phone]    Self-Help/Support Groups Organization         Address  Phone             Notes  [facility] [place] - variety of support groups  [phone]- [phone] Call for more information  Narcotics Anonymous (NA), [facility] [address], [employer] [place]  2 meetings at this location   Statistician         Address  Phone  Notes  [facility] [address],    [place]  [phone]   [facility]  [address] [place] [address], [place], [place] [phone]   [facility] [address] [place], [place] [phone] Admissions: 8am-3pm M-F  [facility] [address],    [place], [place] [phone]   [facility] [address] [place], [place] [phone]   [facility] [address],  [place], [place]  [phone]   [facility] - Intensive  Outpatient [address], [place], [place] [phone]   [facility] ([facility] [address] [place].,  [place], [place] [phone] or [phone]   [facility] (RTS) [address], [place], [place] [phone] Accepts Medicaid  [facility] [address]  [place] [phone] Substance Abuse/Addiction Treatment   [facility] Organization         Address  Phone  Notes  [facility]  [phone]   Angie Fava, PhD [address]vin Kn[address] [place], [place]   [phone] or [phone]   [facility] Behavioral   [address] [place], [place] [phone]   [facility] [address], [place], [place] [phone] Insurance/Medicaid/sponsorship through [facility] and Families [address]                                    [place], [place] [phone] Therapy/tele-psych/case  [facility] [address] [place], [place] [phone]    Dr. Lolly Mustache  [phone]   [facility] [place]  [employer] [facility] Dept. 1) [address] [place] 2) [address] [place] 3)  [address], [place] [phone]  [phone]   [facility] Child Abuse Hotline [phone] or [phone] (After Hours)

## 2015-07-23 NOTE — ED Notes (Signed)
SEE PA Assessment 

## 2015-07-23 NOTE — ED Notes (Signed)
Declined W/C at D/C and was escorted to lobby by RN. 

## 2015-07-23 NOTE — ED Provider Notes (Signed)
CSN: [id number]     Arrival date & time 07/23/15  1440 History  By signing my name below, I, Elon SpannerGarrett Cook, attest that this documentation has been prepared under the direction and in the presence of [employer] Mutualicole Maansi Wike, PA-C. Electronically Signed: Elon SpannerGarrett Cook ED Scribe. 07/23/2015. 2:48 PM.    Chief Complaint  Patient presents with  . Skin Problem   The history is provided by the patient. No language interpreter was used.   HPI Comments: Randall Dorsey is a 30 y.o. male with hx of abscess primarily in the armpit which did not requiring I&D.  He presents to the Emergency Department complaining of gradual onset, gradually worsening, worse with touch abscess on the right axilla x4 days.  He rates his pain 9.5/10.  He denies fever, chills.  NKA.  No past medical history on file. Past Surgical History  Procedure Laterality Date  . Hernia repair     No family history on file. Social History  Substance Use Topics  . Smoking status: Current Every Day Smoker    Types: Cigarettes  . Smokeless tobacco: Not on file  . Alcohol Use: Yes    Review of Systems 10 Systems reviewed and all are negative for acute change except as noted in the HPI.   Allergies  Review of patient's allergies indicates no known allergies.  Home Medications   Prior to Admission medications   Medication Sig Start Date End Date Taking? Authorizing Provider  cyclobenzaprine (FLEXERIL) 10 MG tablet Take 1 tablet (10 mg total) by mouth 2 (two) times daily as needed for muscle spasms. 02/04/15   Eyvonne MechanicJeffrey Hedges, PA-C  HYDROcodone-acetaminophen (NORCO/VICODIN) 5-325 MG per tablet Take 1 tablet by mouth every 6 (six) hours as needed. 02/04/15   Jeffrey Hedges, PA-C   BP 135/94 mmHg  Pulse 99  Temp(Src) 97.6 F (36.4 C) (Oral)  Resp 16  SpO2 100% Physical Exam  Constitutional: He is oriented to person, place, and time. He appears well-developed and well-nourished. No distress.  HENT:  Head: Normocephalic and atraumatic.   Eyes: Conjunctivae and EOM are normal.  Neck: Neck supple. No tracheal deviation present.  Cardiovascular: Normal rate.   Pulmonary/Chest: Effort normal. No respiratory distress.  Musculoskeletal: Normal range of motion.  Neurological: He is alert and oriented to person, place, and time.  Skin: Skin is warm and dry.  2 cm fluctuant abscess to the right axilla with no significant surrounding cellulitis.    Psychiatric: He has a normal mood and affect. His behavior is normal.  Nursing note and vitals reviewed.   ED Course  Procedures (including critical care time)  DIAGNOSTIC STUDIES: Oxygen Saturation is 100% on RA, Normal by my interpretation.    COORDINATION OF CARE:  3:03 PM Discussed plan to perform I&D.  Patient acknowledges and agrees with plan.    INCISION AND DRAINAGE PROCEDURE NOTE: Patient identification was confirmed and verbal consent was obtained. This procedure was performed by Wynetta EmeryNicole Chrissa Meetze, PA-C at 3:04 PM. Site: right axilla Sterile procedures observed Anesthetic used (type and amt): 1.5 mL lidocaine 1% without epi Blade size: 11 Drainage: scant, bloody Complexity: Complex Site anesthetize d, incision made over site, wound drained and explored loculations, rinsed with copious amounts of normal saline, wound packed with sterile gauze, covered with dry, sterile dressing.  Pt tolerated procedure well without complications.  Instructions for care discussed verbally and pt provided with additional written instructions for homecare and f/u.  3:11 PM Discussed plan to order pain medication.  Will prescribe  antibiotic and pain medication.   Labs Review Labs Reviewed - No data to display  Imaging Review No results found. I have personally reviewed and evaluated these images and lab results as part of my medical decision-making.   EKG Interpretation None      MDM   Final diagnoses:  Abscess    Filed Vitals:   07/23/15 1507  BP: 135/94  Pulse: 99   Temp: 97.6 F (36.4 C)  TempSrc: Oral  Resp: 16  SpO2: 100%    Medications  lidocaine (PF) (XYLOCAINE) 1 % injection 5 mL (5 mLs Intradermal Given 07/23/15 1509)  sulfamethoxazole-trimethoprim (BACTRIM DS,SEPTRA DS) 800-160 MG per tablet 1 tablet (1 tablet Oral Given 07/23/15 1543)  HYDROcodone-acetaminophen (NORCO/VICODIN) 5-325 MG per tablet 1 tablet (1 tablet Oral Given 07/23/15 1543)    Randall Dorsey is 30 y.o. male presenting with abscess to right axilla, I and D performed. Patient will be started on antibiotics, advised warm compresses. No signs of systemic infection.  Evaluation does not show pathology that would require ongoing emergent intervention or inpatient treatment. Pt is hemodynamically stable and mentating appropriately. Discussed findings and plan with patient/guardian, who agrees with care plan. All questions answered. Return precautions discussed and outpatient follow up given.   Discharge Medication List as of 07/23/2015  3:14 PM    START taking these medications   Details  sulfamethoxazole-trimethoprim (BACTRIM DS,SEPTRA DS) 800-160 MG tablet Take 1 tablet by mouth 2 (two) times daily., Starting 07/23/2015, Until Mon 07/30/15, Print        I personally performed the services described in this documentation, which was scribed in my presence. The recorded information has been reviewed and is accurate.    Wynetta Emery, PA-C 07/23/15 1627  Marily Memos, MD 07/25/15 [phone]

## 2015-07-23 NOTE — ED Notes (Signed)
PT reports abscess has been present in RT axilla 4 days.

## 2016-06-19 ENCOUNTER — Encounter (HOSPITAL_COMMUNITY): Payer: Self-pay | Admitting: Emergency Medicine

## 2016-06-19 ENCOUNTER — Emergency Department (HOSPITAL_COMMUNITY): Payer: Commercial Managed Care - PPO

## 2016-06-19 ENCOUNTER — Emergency Department (HOSPITAL_COMMUNITY)
Admission: EM | Admit: 2016-06-19 | Discharge: 2016-06-19 | Disposition: A | Discharge status: Home / Self Care | Payer: Commercial Managed Care - PPO | Attending: Emergency Medicine | Admitting: Emergency Medicine

## 2016-06-19 DIAGNOSIS — Y9371 Activity, boxing: Secondary | ICD-10-CM | POA: Insufficient documentation

## 2016-06-19 DIAGNOSIS — W228XXA Striking against or struck by other objects, initial encounter: Secondary | ICD-10-CM | POA: Insufficient documentation

## 2016-06-19 DIAGNOSIS — S60222A Contusion of left hand, initial encounter: Secondary | ICD-10-CM | POA: Diagnosis not present

## 2016-06-19 DIAGNOSIS — Y999 Unspecified external cause status: Secondary | ICD-10-CM | POA: Insufficient documentation

## 2016-06-19 DIAGNOSIS — Y9289 Other specified places as the place of occurrence of the external cause: Secondary | ICD-10-CM | POA: Insufficient documentation

## 2016-06-19 DIAGNOSIS — S6992XA Unspecified injury of left wrist, hand and finger(s), initial encounter: Secondary | ICD-10-CM | POA: Diagnosis present

## 2016-06-19 DIAGNOSIS — J45909 Unspecified asthma, uncomplicated: Secondary | ICD-10-CM | POA: Insufficient documentation

## 2016-06-19 MED ORDER — TRAMADOL HCL 50 MG PO TABS: 50.0000 mg | ORAL_TABLET | Freq: Four times a day (QID) | ORAL | 0 refills | Status: DC | PRN

## 2016-06-19 MED ORDER — NAPROXEN 500 MG PO TABS: 500.0000 mg | ORAL_TABLET | Freq: Two times a day (BID) | ORAL | 0 refills | Status: DC

## 2016-06-19 MED ORDER — NAPROXEN 500 MG PO TABS
500.0000 mg | ORAL_TABLET | Freq: Once | ORAL | Status: AC
  Administered 2016-06-19: 500 mg via ORAL
  Filled 2016-06-19: qty 1

## 2016-06-19 NOTE — ED Notes (Signed)
PA at bedside.

## 2016-06-19 NOTE — ED Triage Notes (Signed)
Pt states that he was punching a punching bag yesterday morning and now has L hand and wrist pain. Alert and oriented.

## 2016-07-06 NOTE — ED Provider Notes (Signed)
[facility] Provider Note   CSN: [id number] Arrival date & time: 06/19/16  0118    History   Chief Complaint Chief Complaint  Patient presents with  . Hand Pain    HPI Randall Dorsey is a 30 y.o. male.  Patient right hand dominant.   The history is provided by the patient. No language interpreter was used.  Hand Injury   The incident occurred yesterday. The incident occurred at the gym. Injury mechanism: punching boxing bag without gloves. The pain is present in the left hand and left wrist. The quality of the pain is described as aching and throbbing. The pain is moderate. The pain has been constant since the incident. The symptoms are aggravated by movement and palpation. He has tried ice for the symptoms. The treatment provided no relief.    History reviewed. No pertinent past medical history.  Patient Active Problem List   Diagnosis Date Noted  . ASTHMA 01/31/2010    Past Surgical History:  Procedure Laterality Date  . HERNIA REPAIR         Home Medications    Prior to Admission medications   Medication Sig Start Date End Date Taking? Authorizing Provider  cyclobenzaprine (FLEXERIL) 10 MG tablet Take 1 tablet (10 mg total) by mouth 2 (two) times daily as needed for muscle spasms. 02/04/15   Eyvonne MechanicJeffrey Hedges, PA-C  HYDROcodone-acetaminophen (NORCO/VICODIN) 5-325 MG tablet Take 1-2 tablets by mouth every 6 hours as needed for pain and/or cough. 07/23/15   Nicole Pisciotta, PA-C  naproxen (NAPROSYN) 500 MG tablet Take 1 tablet (500 mg total) by mouth 2 (two) times daily. 06/19/16   Antony MaduraKelly Helaina Stefano, PA-C  traMADol (ULTRAM) 50 MG tablet Take 1 tablet (50 mg total) by mouth every 6 (six) hours as needed. 06/19/16   Antony MaduraKelly Jaysa Kise, PA-C    Family History History reviewed. No pertinent family history.  Social History Social History  Substance Use Topics  . Smoking status: Current Every Day Smoker    Types: Cigarettes  . Smokeless tobacco: Not on file  . Alcohol use  Yes     Allergies   Patient has no known allergies.   Review of Systems Review of Systems Ten systems reviewed and are negative for acute change, except as noted in the HPI.    Physical Exam Updated Vital Signs BP 130/94 (BP Location: Right Arm)   Pulse 66   Temp 98.4 F (36.9 C) (Oral)   Resp 18   SpO2 100%   Physical Exam  Constitutional: He is oriented to person, place, and time. He appears well-developed and well-nourished. No distress.  Nontoxic and in no distress  HENT:  Head: Normocephalic and atraumatic.  Eyes: Conjunctivae and EOM are normal. No scleral icterus.  Neck: Normal range of motion.  Cardiovascular: Normal rate, regular rhythm and intact distal pulses.   Distal radial pulse 2+ in the LUE  Pulmonary/Chest: Effort normal. No respiratory distress. He has no wheezes.  Respirations even and unlabored  Musculoskeletal: Normal range of motion.       Left wrist: He exhibits tenderness. He exhibits normal range of motion, no swelling, no effusion, no crepitus and no deformity.       Left hand: He exhibits tenderness and swelling (Minimal). He exhibits normal range of motion, normal capillary refill and no deformity. Normal sensation noted. Normal strength noted.  Neurological: He is alert and oriented to person, place, and time. He exhibits normal muscle tone. Coordination normal.  Sensation to light touch intact  in the left upper extremity  Skin: Skin is warm and dry. No rash noted. He is not diaphoretic. No erythema. No pallor.  Psychiatric: He has a normal mood and affect. His behavior is normal.  Nursing note and vitals reviewed.    ED Treatments / Results  Labs (all labs ordered are listed, but only abnormal results are displayed) Labs Reviewed - No data to display  EKG  EKG Interpretation None       Radiology Dg Wrist Complete Left  Result Date: 06/19/2016 CLINICAL DATA:  30 y/o M; punching a punching bag and developed ulnar-sided pain.  EXAM: LEFT HAND - COMPLETE 3+ VIEW; LEFT WRIST - COMPLETE 3+ VIEW COMPARISON:  None. FINDINGS: Left hand findings: There is no evidence of fracture or dislocation. There is no evidence of arthropathy or other focal bone abnormality. Soft tissues are unremarkable. Left wrist findings: There is no evidence of fracture or dislocation. There is no evidence of arthropathy or other focal bone abnormality. Soft tissues are unremarkable. IMPRESSION: No acute fracture or dislocation identified. Electronically Signed   By: Mitzi HansenLance  Furusawa-Stratton M.D.   On: 06/19/2016 02:42   Dg Hand Complete Left  Result Date: 06/19/2016 CLINICAL DATA:  30 y/o M; punching a punching bag and developed ulnar-sided pain. EXAM: LEFT HAND - COMPLETE 3+ VIEW; LEFT WRIST - COMPLETE 3+ VIEW COMPARISON:  None. FINDINGS: Left hand findings: There is no evidence of fracture or dislocation. There is no evidence of arthropathy or other focal bone abnormality. Soft tissues are unremarkable. Left wrist findings: There is no evidence of fracture or dislocation. There is no evidence of arthropathy or other focal bone abnormality. Soft tissues are unremarkable. IMPRESSION: No acute fracture or dislocation identified. Electronically Signed   By: Mitzi HansenLance  Furusawa-Stratton M.D.   On: 06/19/2016 02:42    Procedures Procedures (including critical care time)  Medications Ordered in ED Medications  naproxen (NAPROSYN) tablet 500 mg (500 mg Oral Given 06/19/16 0544)     Initial Impression / Assessment and Plan / ED Course  I have reviewed the triage vital signs and the nursing notes.  Pertinent labs & imaging results that were available during my care of the patient were reviewed by me and considered in my medical decision making (see chart for details).  Clinical Course     30 year old male presents to the emergency department for left hand pain after punching a boxing bag without gloves on. Symptoms consistent with contusion. X-ray negative  for fracture. There is no bony deformity or crepitus. Patient neurovascularly intact. Compartments soft. Will manage supportively with icing and NSAIDs. Patient given brace for stability. Return precautions discussed and provided. Patient discharged in stable condition with no unaddressed concerns.   Final Clinical Impressions(s) / ED Diagnoses   Final diagnoses:  Contusion of left hand, initial encounter    New Prescriptions Discharge Medication List as of 06/19/2016  5:14 AM    START taking these medications   Details  naproxen (NAPROSYN) 500 MG tablet Take 1 tablet (500 mg total) by mouth 2 (two) times daily., Starting Thu 06/19/2016, Print    traMADol (ULTRAM) 50 MG tablet Take 1 tablet (50 mg total) by mouth every 6 (six) hours as needed., Starting Thu 06/19/2016, Print         Minerva ParkKelly Bobbyjo Marulanda, PA-C 07/06/16 2144    Arby BarretteMarcy Pfeiffer, MD 07/09/16 [phone]

## 2018-01-23 ENCOUNTER — Other Ambulatory Visit: Payer: Self-pay

## 2018-01-23 ENCOUNTER — Encounter (HOSPITAL_COMMUNITY): Payer: Self-pay | Admitting: *Deleted

## 2018-01-23 ENCOUNTER — Ambulatory Visit (HOSPITAL_COMMUNITY)
Admission: EM | Admit: 2018-01-23 | Discharge: 2018-01-23 | Disposition: A | Discharge status: Home / Self Care | Payer: Commercial Managed Care - PPO | Attending: Family Medicine | Admitting: Family Medicine

## 2018-01-23 DIAGNOSIS — R51 Headache: Secondary | ICD-10-CM

## 2018-01-23 DIAGNOSIS — R509 Fever, unspecified: Secondary | ICD-10-CM

## 2018-01-23 DIAGNOSIS — R519 Headache, unspecified: Secondary | ICD-10-CM

## 2018-01-23 DIAGNOSIS — R197 Diarrhea, unspecified: Secondary | ICD-10-CM

## 2018-01-23 MED ORDER — METOCLOPRAMIDE HCL 5 MG/ML IJ SOLN
5.0000 mg | Freq: Once | INTRAMUSCULAR | Status: AC
  Administered 2018-01-23: 5 mg via INTRAMUSCULAR

## 2018-01-23 MED ORDER — IBUPROFEN 400 MG PO TABS: 400.0000 mg | ORAL_TABLET | Freq: Three times a day (TID) | ORAL | 0 refills | Status: DC | PRN

## 2018-01-23 MED ORDER — METOCLOPRAMIDE HCL 5 MG/ML IJ SOLN
INTRAMUSCULAR | Status: AC
  Filled 2018-01-23: qty 2

## 2018-01-23 MED ORDER — KETOROLAC TROMETHAMINE 60 MG/2ML IM SOLN
30.0000 mg | Freq: Once | INTRAMUSCULAR | Status: AC
  Administered 2018-01-23: 30 mg via INTRAMUSCULAR

## 2018-01-23 MED ORDER — KETOROLAC TROMETHAMINE 30 MG/ML IJ SOLN
INTRAMUSCULAR | Status: AC
  Filled 2018-01-23: qty 1

## 2018-01-23 NOTE — Discharge Instructions (Signed)
I am sorry you feel sick. You likely have viral illness which is self limiting. We did not get blood work since it will not be resulted in 2 days. Please go to the ED if symptoms worsens.

## 2018-01-23 NOTE — ED Provider Notes (Addendum)
[facility]    CSN: [id number] Arrival date & time: 01/23/18  1721     History   Chief Complaint No chief complaint on file.   HPI Randall Dorsey is a 32 y.o. male.   The history is provided by the patient and the spouse. No language interpreter was used.  Fever  Max temp prior to arrival:  100.3 Temp source:  Oral Severity:  Moderate Onset quality:  Gradual Duration:  5 days Timing:  Intermittent Progression:  Waxing and waning Chronicity:  New Relieved by:  Acetaminophen Worsened by:  Nothing Ineffective treatments:  Acetaminophen (Water and rest) Associated symptoms: chills, diarrhea, headaches and nausea   Associated symptoms: no chest pain, no congestion, no cough, no dysuria, no ear pain and no vomiting   Diarrhea:    Quality:  Watery (brownish, non-blood watery stool)   Severity:  Moderate   Duration:  3 days   Timing:  Intermittent   Progression:  Unchanged Risk factors: no recent sickness, no recent travel and no sick contacts     History reviewed. No pertinent past medical history.  Patient Active Problem List   Diagnosis Date Noted  . ASTHMA 01/31/2010    Past Surgical History:  Procedure Laterality Date  . HERNIA REPAIR         Home Medications    Prior to Admission medications   Medication Sig Start Date End Date Taking? Authorizing Provider  acetaminophen (TYLENOL) 500 MG tablet Take 1,000 mg by mouth every 6 (six) hours as needed.   Yes Emergency, Nurse, RN  cyclobenzaprine (FLEXERIL) 10 MG tablet Take 1 tablet (10 mg total) by mouth 2 (two) times daily as needed for muscle spasms. 02/04/15   Hedges, Tinnie Gens, PA-C  HYDROcodone-acetaminophen (NORCO/VICODIN) 5-325 MG tablet Take 1-2 tablets by mouth every 6 hours as needed for pain and/or cough. 07/23/15   Pisciotta, Joni Reining, PA-C  naproxen (NAPROSYN) 500 MG tablet Take 1 tablet (500 mg total) by mouth 2 (two) times daily. 06/19/16   Antony Madura, PA-C  traMADol (ULTRAM) 50 MG tablet  Take 1 tablet (50 mg total) by mouth every 6 (six) hours as needed. 06/19/16   Antony Madura, PA-C    Family History History reviewed. No pertinent family history.  Social History Social History   Tobacco Use  . Smoking status: Current Every Day Smoker    Types: Cigarettes  . Smokeless tobacco: Never Used  Substance Use Topics  . Alcohol use: Yes  . Drug use: Not on file     Allergies   Patient has no known allergies.   Review of Systems Review of Systems  Constitutional: Positive for appetite change, chills and fever.  HENT: Negative for congestion and ear pain.   Respiratory: Negative for cough.   Cardiovascular: Negative for chest pain.  Gastrointestinal: Positive for diarrhea and nausea. Negative for vomiting.  Genitourinary: Negative for dysuria.  Neurological: Positive for headaches.  All other systems reviewed and are negative.    Physical Exam Triage Vital Signs ED Triage Vitals  Enc Vitals Group     BP 01/23/18 1816 111/69     Pulse Rate 01/23/18 1816 65     Resp 01/23/18 1816 16     Temp 01/23/18 1816 99.1 F (37.3 C)     Temp Source 01/23/18 1816 Oral     SpO2 01/23/18 1816 100 %     Weight 01/23/18 1819 148 lb (67.1 kg)     Height --  Head Circumference --      Peak Flow --      Pain Score 01/23/18 1818 9     Pain Loc --      Pain Edu? --      Excl. in GC? --    No data found.  Updated Vital Signs BP 111/69   Pulse 65   Temp 99.1 F (37.3 C) (Oral)   Resp 16   Wt 148 lb (67.1 kg)   SpO2 100%   BMI 24.63 kg/m   Visual Acuity Right Eye Distance:   Left Eye Distance:   Bilateral Distance:    Right Eye Near:   Left Eye Near:    Bilateral Near:     Physical Exam  Constitutional: He is oriented to person, place, and time. He appears well-developed. No distress.  HENT:  Head: Normocephalic.  Right Ear: Tympanic membrane and ear canal normal.  Left Ear: Tympanic membrane and ear canal normal.  Mouth/Throat: Uvula is midline,  oropharynx is clear and moist and mucous membranes are normal. No tonsillar exudate.  Cardiovascular: Normal rate, regular rhythm and normal heart sounds.  No murmur heard. Pulmonary/Chest: Effort normal and breath sounds normal. No stridor. No respiratory distress. He has no wheezes.  Abdominal: Soft. Bowel sounds are normal. He exhibits no distension and no mass. There is no tenderness.  Musculoskeletal: Normal range of motion. He exhibits no edema.  Neurological: He is alert and oriented to person, place, and time. He displays normal reflexes. No cranial nerve deficit or sensory deficit. He exhibits normal muscle tone. Coordination normal.  Skin: Skin is warm. Capillary refill takes less than 2 seconds.  Nursing note and vitals reviewed.    UC Treatments / Results  Labs (all labs ordered are listed, but only abnormal results are displayed) Labs Reviewed - No data to display  EKG None  Radiology No results found.  Procedures Procedures (including critical care time)  Medications Ordered in UC Medications - No data to display  Initial Impression / Assessment and Plan / UC Course  I have reviewed the triage vital signs and the nursing notes.  Pertinent labs & imaging results that were available during my care of the patient were reviewed by me and considered in my medical decision making (see chart for details).  Clinical Course as of Jan 24 1912  Sat Jan 23, 2018  1910 Fever with chills. Likely viral illness. Mildly to moderately ill appearing. Currently afebrile. Use Ibuprofen as needed for fever. Keep well hydrated and rest at home. ED visit if no improvement.   [KE]  1911 Diarrhea. Likely part of viral illness. Does not appear dehydrated. Oral hydration recommended. ED if worsening.   [KE]  1912 Headache likely part of viral illness. Toradol and antiemetic given during this visit. No signs of meningeal irritation. No neurologic deficit. ED recommended if  symptoms persists or worsens. He verbalized understanding.   [KE]    Clinical Course User Index [KE] Doreene ElandEniola, Maritza Goldsborough T, MD    Nonintractable headache, unspecified chronicity pattern, unspecified headache type  Fever, unspecified  Diarrhea, unspecified type   Final Clinical Impressions(s) / UC Diagnoses   Final diagnoses:  None   Discharge Instructions   None    ED Prescriptions    None     Controlled Substance Prescriptions [place] Controlled Substance Registry consulted? Not Applicable   Doreene ElandEniola, Travanti Mcmanus T, MD 01/23/18 1918    Doreene ElandEniola, Dhruti Ghuman T, MD 01/23/18 [id number]

## 2018-01-23 NOTE — ED Triage Notes (Signed)
Pt states that he had a temperature of 101.9 on Tuesday. He states that temperature improved but that today he got have a headache, nausea, and chills.

## 2018-01-25 ENCOUNTER — Encounter (HOSPITAL_COMMUNITY): Payer: Self-pay | Admitting: Emergency Medicine

## 2018-01-25 ENCOUNTER — Emergency Department (HOSPITAL_COMMUNITY)
Admission: EM | Admit: 2018-01-25 | Discharge: 2018-01-25 | Disposition: A | Discharge status: Home / Self Care | Payer: Self-pay | Attending: Emergency Medicine | Admitting: Emergency Medicine

## 2018-01-25 DIAGNOSIS — B349 Viral infection, unspecified: Secondary | ICD-10-CM | POA: Insufficient documentation

## 2018-01-25 DIAGNOSIS — J45909 Unspecified asthma, uncomplicated: Secondary | ICD-10-CM | POA: Insufficient documentation

## 2018-01-25 DIAGNOSIS — F17210 Nicotine dependence, cigarettes, uncomplicated: Secondary | ICD-10-CM | POA: Insufficient documentation

## 2018-01-25 LAB — URINALYSIS, ROUTINE W REFLEX MICROSCOPIC
Bilirubin Urine: NEGATIVE
Glucose, UA: NEGATIVE mg/dL
Hgb urine dipstick: NEGATIVE
Ketones, ur: NEGATIVE mg/dL
LEUKOCYTES UA: NEGATIVE
NITRITE: NEGATIVE
PROTEIN: NEGATIVE mg/dL
SPECIFIC GRAVITY, URINE: 1.024 (ref 1.005–1.030)
pH: 6 (ref 5.0–8.0)

## 2018-01-25 LAB — LIPASE, BLOOD: Lipase: 33 U/L (ref 11–51)

## 2018-01-25 LAB — COMPREHENSIVE METABOLIC PANEL
ALBUMIN: 4.1 g/dL (ref 3.5–5.0)
ALT: 17 U/L (ref 0–44)
ANION GAP: 9 (ref 5–15)
AST: 26 U/L (ref 15–41)
Alkaline Phosphatase: 58 U/L (ref 38–126)
BILIRUBIN TOTAL: 0.4 mg/dL (ref 0.3–1.2)
BUN: 10 mg/dL (ref 6–20)
CO2: 26 mmol/L (ref 22–32)
Calcium: 9.1 mg/dL (ref 8.9–10.3)
Chloride: 103 mmol/L (ref 98–111)
Creatinine, Ser: 1.01 mg/dL (ref 0.61–1.24)
GFR calc Af Amer: >60 mL/min (ref >60)
GFR calc non Af Amer: >60 mL/min (ref >60)
GLUCOSE: 131 mg/dL — AB (ref 70–99)
POTASSIUM: 3.8 mmol/L (ref 3.5–5.1)
Sodium: 138 mmol/L (ref 135–145)
TOTAL PROTEIN: 7.2 g/dL (ref 6.5–8.1)

## 2018-01-25 LAB — CBC
HCT: 42.1 % (ref 39.0–52.0)
HEMOGLOBIN: 14.4 g/dL (ref 13.0–17.0)
MCH: 32.8 pg (ref 26.0–34.0)
MCHC: 34.2 g/dL (ref 30.0–36.0)
MCV: 95.9 fL (ref 78.0–100.0)
Platelets: 267 10*3/uL (ref 150–400)
RBC: 4.39 MIL/uL (ref 4.22–5.81)
RDW: 11.2 % — ABNORMAL LOW (ref 11.5–15.5)
WBC: 5 10*3/uL (ref 4.0–10.5)

## 2018-01-25 MED ORDER — SODIUM CHLORIDE 0.9 % IV BOLUS
1000.0000 mL | Freq: Once | INTRAVENOUS | Status: AC
  Administered 2018-01-25: 1000 mL via INTRAVENOUS

## 2018-01-25 MED ORDER — DOXYCYCLINE HYCLATE 100 MG PO TABS
100.0000 mg | ORAL_TABLET | Freq: Once | ORAL | Status: AC
  Administered 2018-01-25: 100 mg via ORAL
  Filled 2018-01-25: qty 1

## 2018-01-25 MED ORDER — DOXYCYCLINE HYCLATE 100 MG PO CAPS: 100.0000 mg | ORAL_CAPSULE | Freq: Two times a day (BID) | ORAL | 0 refills | Status: DC

## 2018-01-25 MED ORDER — TRAMADOL HCL 50 MG PO TABS: ORAL_TABLET | ORAL | 0 refills | Status: DC

## 2018-01-25 MED ORDER — KETOROLAC TROMETHAMINE 30 MG/ML IJ SOLN
30.0000 mg | Freq: Once | INTRAMUSCULAR | Status: AC
  Administered 2018-01-25: 30 mg via INTRAVENOUS
  Filled 2018-01-25: qty 1

## 2018-01-25 NOTE — ED Triage Notes (Signed)
Pt reports body aches since Saturday. Nausea comes on when drinks cool/cold liquids.  Reports last Tuesday and Wed have fevers. Thursday felt fine and able to work. Pt denies diarrhea since Saturday. Pt reports "chill pains are everywhere".

## 2018-01-25 NOTE — ED Provider Notes (Signed)
[facility]-EMERGENCY DEPT Provider Note   CSN: [id number] Arrival date & time: 01/25/18  1610     History   Chief Complaint Chief Complaint  Patient presents with  . bodyaches  . Nausea    HPI Randall Dorsey is a 32 y.o. male.  Patient complains of fever chills headache for a week.  The history is provided by the patient. No language interpreter was used.  Illness  This is a new problem. The current episode started more than 1 week ago. The problem occurs constantly. The problem has not changed since onset.Pertinent negatives include no chest pain, no abdominal pain and no headaches. Nothing aggravates the symptoms. Nothing relieves the symptoms.    History reviewed. No pertinent past medical history.  Patient Active Problem List   Diagnosis Date Noted  . ASTHMA 01/31/2010    Past Surgical History:  Procedure Laterality Date  . HERNIA REPAIR          Home Medications    Prior to Admission medications   Medication Sig Start Date End Date Taking? Authorizing Provider  acetaminophen (TYLENOL) 500 MG tablet Take 1,000 mg by mouth every 6 (six) hours as needed.   Yes Emergency, Nurse, RN  ibuprofen (ADVIL,MOTRIN) 400 MG tablet Take 1 tablet (400 mg total) by mouth every 8 (eight) hours as needed for fever or headache. 01/23/18  Yes Doreene Eland, MD  cyclobenzaprine (FLEXERIL) 10 MG tablet Take 1 tablet (10 mg total) by mouth 2 (two) times daily as needed for muscle spasms. Patient not taking: Reported on 01/25/2018 02/04/15   Hedges, Tinnie Gens, PA-C  doxycycline (VIBRAMYCIN) 100 MG capsule Take 1 capsule (100 mg total) by mouth 2 (two) times daily. One po bid x 7 days 01/25/18   Bethann Berkshire, MD  HYDROcodone-acetaminophen (NORCO/VICODIN) 5-325 MG tablet Take 1-2 tablets by mouth every 6 hours as needed for pain and/or cough. Patient not taking: Reported on 01/25/2018 07/23/15   Pisciotta, Joni Reining, PA-C  naproxen (NAPROSYN) 500 MG tablet Take 1 tablet  (500 mg total) by mouth 2 (two) times daily. Patient not taking: Reported on 01/25/2018 06/19/16   Antony Madura, PA-C  traMADol Janean Sark) 50 MG tablet Take 1 every 6 hours for pain not helped by Tylenol or Motrin 01/25/18   Bethann Berkshire, MD    Family History No family history on file.  Social History Social History   Tobacco Use  . Smoking status: Current Every Day Smoker    Types: Cigarettes  . Smokeless tobacco: Never Used  Substance Use Topics  . Alcohol use: Yes  . Drug use: Not on file     Allergies   Patient has no known allergies.   Review of Systems Review of Systems  Constitutional: Negative for appetite change and fatigue.  HENT: Negative for congestion, ear discharge and sinus pressure.        Headache  Eyes: Negative for discharge.  Respiratory: Negative for cough.   Cardiovascular: Negative for chest pain.  Gastrointestinal: Negative for abdominal pain and diarrhea.  Genitourinary: Negative for frequency and hematuria.  Musculoskeletal: Negative for back pain.  Skin: Negative for rash.  Neurological: Negative for seizures and headaches.  Psychiatric/Behavioral: Negative for hallucinations.     Physical Exam Updated Vital Signs BP 127/69   Pulse 71   Temp 100 F (37.8 C) (Oral)   Resp 15   SpO2 100%   Physical Exam  Constitutional: He is oriented to person, place, and time. He appears well-developed.  HENT:  Head: Normocephalic.  Eyes: Conjunctivae and EOM are normal. No scleral icterus.  Neck: Neck supple. No thyromegaly present.  Cardiovascular: Normal rate and regular rhythm. Exam reveals no gallop and no friction rub.  No murmur heard. Pulmonary/Chest: No stridor. He has no wheezes. He has no rales. He exhibits no tenderness.  Abdominal: He exhibits no distension. There is no tenderness. There is no rebound.  Musculoskeletal: Normal range of motion. He exhibits no edema.  Lymphadenopathy:    He has no cervical adenopathy.  Neurological:  He is oriented to person, place, and time. He exhibits normal muscle tone. Coordination normal.  Skin: No rash noted. No erythema.  Psychiatric: He has a normal mood and affect. His behavior is normal.     ED Treatments / Results  Labs (all labs ordered are listed, but only abnormal results are displayed) Labs Reviewed  COMPREHENSIVE METABOLIC PANEL - Abnormal; Notable for the following components:      Result Value   Glucose, Bld 131 (*)    All other components within normal limits  CBC - Abnormal; Notable for the following components:   RDW 11.2 (*)    All other components within normal limits  LIPASE, BLOOD  URINALYSIS, ROUTINE W REFLEX MICROSCOPIC    EKG None  Radiology No results found.  Procedures Procedures (including critical care time)  Medications Ordered in ED Medications  doxycycline (VIBRA-TABS) tablet 100 mg (has no administration in time range)  ketorolac (TORADOL) 30 MG/ML injection 30 mg (30 mg Intravenous Given 01/25/18 2042)  sodium chloride 0.9 % bolus 1,000 mL (1,000 mLs Intravenous New Bag/Given 01/25/18 2043)     Initial Impression / Assessment and Plan / ED Course  I have reviewed the triage vital signs and the nursing notes.  Pertinent labs & imaging results that were available during my care of the patient were reviewed by me and considered in my medical decision making (see chart for details).   Labs unremarkable.  Patient with headache and myalgias mild fever.  Suspect viral syndrome but will cover patient for tick exposure with doxycycline.   Final Clinical Impressions(s) / ED Diagnoses   Final diagnoses:  Viral syndrome    ED Discharge Orders        Ordered    doxycycline (VIBRAMYCIN) 100 MG capsule  2 times daily     01/25/18 2213    traMADol (ULTRAM) 50 MG tablet     01/25/18 2213       Bethann BerkshireZammit, Tyreik Delahoussaye, MD 01/25/18 2217

## 2018-01-25 NOTE — Discharge Instructions (Addendum)
Drink plenty of fluids.  Follow up next week if not improving. °

## 2018-09-13 ENCOUNTER — Emergency Department (HOSPITAL_COMMUNITY)
Admission: EM | Admit: 2018-09-13 | Discharge: 2018-09-13 | Disposition: A | Discharge status: Home / Self Care | Payer: Self-pay | Attending: Emergency Medicine | Admitting: Emergency Medicine

## 2018-09-13 ENCOUNTER — Other Ambulatory Visit: Payer: Self-pay

## 2018-09-13 ENCOUNTER — Encounter (HOSPITAL_COMMUNITY): Payer: Self-pay | Admitting: Emergency Medicine

## 2018-09-13 DIAGNOSIS — F17210 Nicotine dependence, cigarettes, uncomplicated: Secondary | ICD-10-CM | POA: Insufficient documentation

## 2018-09-13 DIAGNOSIS — K644 Residual hemorrhoidal skin tags: Secondary | ICD-10-CM | POA: Insufficient documentation

## 2018-09-13 DIAGNOSIS — Z79899 Other long term (current) drug therapy: Secondary | ICD-10-CM | POA: Insufficient documentation

## 2018-09-13 DIAGNOSIS — J45909 Unspecified asthma, uncomplicated: Secondary | ICD-10-CM | POA: Insufficient documentation

## 2018-09-13 MED ORDER — HYDROCORTISONE 2.5 % RE CREA: TOPICAL_CREAM | RECTAL | 0 refills | Status: DC

## 2018-09-13 NOTE — Discharge Instructions (Signed)
It was my pleasure taking care of you today!   Increase hydration and follow a high fiber diet. Sitz baths and Epsom salt baths will also help improve symptoms. Apply topical hydrocortisone cream twice daily.   If symptoms persist longer than 1-2 weeks, please follow up with your primary care provider. Please return to ER for new or worsening symptoms, any additional concerns.

## 2018-09-13 NOTE — ED Triage Notes (Signed)
Pt complaint of hemorrhoid pain onset 2 days ago. Denies bleeding "just a lot of pain."

## 2018-09-13 NOTE — ED Provider Notes (Signed)
[facility]-EMERGENCY DEPT Provider Note   CSN: [id number] Arrival date & time: 09/13/18  1230    History   Chief Complaint Chief Complaint  Patient presents with  . Hemorrhoids    HPI Randall Dorsey is a 33 y.o. male.     The history is provided by the patient and medical records. No language interpreter was used.   DON SAUVAGEAU is a 33 y.o. male with a history of asthma who presents emergency department for concerns of hemorrhoid.  Patient states that 2 days ago, he started having rectal pain.  He felt a knot there and assumed it was a hemorrhoid.  He has been using over-the-counter hemorrhoid cream with little improvement.  He denies any constipation.  Denies any history of similar.  History reviewed. No pertinent past medical history.  Patient Active Problem List   Diagnosis Date Noted  . ASTHMA 01/31/2010    Past Surgical History:  Procedure Laterality Date  . HERNIA REPAIR          Home Medications    Prior to Admission medications   Medication Sig Start Date End Date Taking? Authorizing Provider  acetaminophen (TYLENOL) 500 MG tablet Take 1,000 mg by mouth every 6 (six) hours as needed.    Emergency, Nurse, RN  cyclobenzaprine (FLEXERIL) 10 MG tablet Take 1 tablet (10 mg total) by mouth 2 (two) times daily as needed for muscle spasms. Patient not taking: Reported on 01/25/2018 02/04/15   Hedges, Tinnie Gens, PA-C  doxycycline (VIBRAMYCIN) 100 MG capsule Take 1 capsule (100 mg total) by mouth 2 (two) times daily. One po bid x 7 days 01/25/18   Bethann Berkshire, MD  HYDROcodone-acetaminophen (NORCO/VICODIN) 5-325 MG tablet Take 1-2 tablets by mouth every 6 hours as needed for pain and/or cough. Patient not taking: Reported on 01/25/2018 07/23/15   Pisciotta, Joni Reining, PA-C  hydrocortisone (ANUSOL-HC) 2.5 % rectal cream Apply rectally 2 times daily 09/13/18   , Chase Picket, PA-C  ibuprofen (ADVIL,MOTRIN) 400 MG tablet Take 1 tablet (400 mg total) by  mouth every 8 (eight) hours as needed for fever or headache. 01/23/18   Doreene Eland, MD  naproxen (NAPROSYN) 500 MG tablet Take 1 tablet (500 mg total) by mouth 2 (two) times daily. Patient not taking: Reported on 01/25/2018 06/19/16   Antony Madura, PA-C  traMADol Janean Sark) 50 MG tablet Take 1 every 6 hours for pain not helped by Tylenol or Motrin 01/25/18   Bethann Berkshire, MD    Family History No family history on file.  Social History Social History   Tobacco Use  . Smoking status: Current Every Day Smoker    Types: Cigarettes  . Smokeless tobacco: Never Used  Substance Use Topics  . Alcohol use: Yes  . Drug use: Not on file     Allergies   Patient has no known allergies.   Review of Systems Review of Systems  Constitutional: Negative for fever.  Gastrointestinal: Positive for rectal pain. Negative for abdominal pain, blood in stool, constipation, diarrhea, nausea and vomiting.  Genitourinary: Negative for difficulty urinating and dysuria.  Musculoskeletal: Negative for arthralgias and myalgias.     Physical Exam Updated Vital Signs BP 120/89 (BP Location: Right Arm)   Pulse 87   Temp 98.4 F (36.9 C) (Oral)   Resp 16   Ht 5\' 5"  (1.651 m)   Wt 60.8 kg   SpO2 100%   BMI 22.30 kg/m   Physical Exam Vitals signs and nursing note reviewed.  Constitutional:      General: He is not in acute distress.    Appearance: He is well-developed.  HENT:     Head: Normocephalic and atraumatic.  Neck:     Musculoskeletal: Neck supple.  Cardiovascular:     Rate and Rhythm: Normal rate and regular rhythm.     Heart sounds: Normal heart sounds. No murmur.  Pulmonary:     Effort: Pulmonary effort is normal. No respiratory distress.     Breath sounds: Normal breath sounds.  Abdominal:     General: There is no distension.     Palpations: Abdomen is soft.     Tenderness: There is no abdominal tenderness.  Genitourinary:    Comments: Chaperone present for exam.  Tender  external hemorrhoid noted.  Good fleshy color. Skin:    General: Skin is warm and dry.  Neurological:     Mental Status: He is alert and oriented to person, place, and time.      ED Treatments / Results  Labs (all labs ordered are listed, but only abnormal results are displayed) Labs Reviewed - No data to display  EKG None  Radiology No results found.  Procedures Procedures (including critical care time)  Medications Ordered in ED Medications - No data to display   Initial Impression / Assessment and Plan / ED Course  I have reviewed the triage vital signs and the nursing notes.  Pertinent labs & imaging results that were available during my care of the patient were reviewed by me and considered in my medical decision making (see chart for details).       Randall Dorsey is a 33 y.o. male who presents to ED for rectal pain for the last 2 days.  Exam consistent with external hemorrhoid.  Counseled on home wound care instructions.  Rx for Anusol cream provided.  PCP follow-up if no improvement.  Reasons to return to the emergency department were discussed and all questions were answered.   Final Clinical Impressions(s) / ED Diagnoses   Final diagnoses:  External hemorrhoids    ED Discharge Orders         Ordered    hydrocortisone (ANUSOL-HC) 2.5 % rectal cream     09/13/18 1433           , Chase Picket, PA-C 09/13/18 1516    Wynetta Fines, MD 09/15/18 2348

## 2019-12-24 ENCOUNTER — Emergency Department (HOSPITAL_COMMUNITY): Payer: Self-pay

## 2019-12-24 ENCOUNTER — Other Ambulatory Visit: Payer: Self-pay

## 2019-12-24 ENCOUNTER — Emergency Department (HOSPITAL_COMMUNITY)
Admission: EM | Admit: 2019-12-24 | Discharge: 2019-12-24 | Disposition: A | Discharge status: Home / Self Care | Payer: Self-pay | Attending: Emergency Medicine | Admitting: Emergency Medicine

## 2019-12-24 ENCOUNTER — Encounter (HOSPITAL_COMMUNITY): Payer: Self-pay | Admitting: Emergency Medicine

## 2019-12-24 DIAGNOSIS — F17210 Nicotine dependence, cigarettes, uncomplicated: Secondary | ICD-10-CM | POA: Insufficient documentation

## 2019-12-24 DIAGNOSIS — F419 Anxiety disorder, unspecified: Secondary | ICD-10-CM | POA: Insufficient documentation

## 2019-12-24 DIAGNOSIS — J45909 Unspecified asthma, uncomplicated: Secondary | ICD-10-CM | POA: Insufficient documentation

## 2019-12-24 DIAGNOSIS — R079 Chest pain, unspecified: Secondary | ICD-10-CM | POA: Insufficient documentation

## 2019-12-24 LAB — TROPONIN I (HIGH SENSITIVITY)
Troponin I (High Sensitivity): <2 ng/L (ref <18)
Troponin I (High Sensitivity): 3 ng/L (ref <18)

## 2019-12-24 LAB — CBC
HCT: 43.3 % (ref 39.0–52.0)
Hemoglobin: 14.8 g/dL (ref 13.0–17.0)
MCH: 33.6 pg (ref 26.0–34.0)
MCHC: 34.2 g/dL (ref 30.0–36.0)
MCV: 98.4 fL (ref 80.0–100.0)
Platelets: 292 10*3/uL (ref 150–400)
RBC: 4.4 MIL/uL (ref 4.22–5.81)
RDW: 11 % — ABNORMAL LOW (ref 11.5–15.5)
WBC: 3.6 10*3/uL — ABNORMAL LOW (ref 4.0–10.5)
nRBC: 0 % (ref 0.0–0.2)

## 2019-12-24 LAB — BASIC METABOLIC PANEL
Anion gap: 6 (ref 5–15)
BUN: 9 mg/dL (ref 6–20)
CO2: 28 mmol/L (ref 22–32)
Calcium: 9.8 mg/dL (ref 8.9–10.3)
Chloride: 105 mmol/L (ref 98–111)
Creatinine, Ser: 1.03 mg/dL (ref 0.61–1.24)
GFR calc Af Amer: >60 mL/min (ref >60)
GFR calc non Af Amer: >60 mL/min (ref >60)
Glucose, Bld: 78 mg/dL (ref 70–99)
Potassium: 3.9 mmol/L (ref 3.5–5.1)
Sodium: 139 mmol/L (ref 135–145)

## 2019-12-24 MED ORDER — SODIUM CHLORIDE 0.9% FLUSH: 3.0000 mL | Freq: Once | INTRAVENOUS | Status: DC

## 2019-12-24 NOTE — ED Provider Notes (Signed)
[facility] EMERGENCY DEPARTMENT Provider Note   CSN: [id number] Arrival date & time: 12/24/19  1106     History Chief Complaint  Patient presents with  . Chest Pain    Randall Dorsey is a 34 y.o. male with pertinent past medical history of asthma that presents the emergency department today for chest pain.  States that chest pain has been occurring for the past 6 days, states that it radiates down into his left arm for the past 2 to 3 days.  States that the chest pain has been on and off but for the past 24 hours it has been constant.  States that it is a stabbing pain and also has associated palpitations with the pain.  Denies any cardiac history.  Also admits to some shortness of breath whenever he has the pain, states that it, takes his breath away whenever he has the pain.  Denies any exertional component to the chest pain or shortness of breath.  States that arm pain starts in his elbow and radiates up into his chest.  States that he also has some associated paresthesias on his left fingers.  States that this has been occurring for the same amount of time.  Denies any nausea, vomiting, back pain, dizziness, headache, vision changes, leg edema.  Denies any family history of sudden cardiac death.  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  Denies any alcohol use, does smoke a pack per day.  Has never had anything happen like this to him before.  Has not taken anything for this. HPI     History reviewed. No pertinent past medical history.  Patient Active Problem List   Diagnosis Date Noted  . ASTHMA 01/31/2010    Past Surgical History:  Procedure Laterality Date  . HERNIA REPAIR         No family history on file.  Social History   Tobacco Use  . Smoking status: Current Every Day Smoker    Types: Cigarettes  . Smokeless tobacco: Never Used  Substance Use Topics  . Alcohol use: Yes  . Drug use: Not on file    Home  Medications Prior to Admission medications   Medication Sig Start Date End Date Taking? Authorizing Provider  acetaminophen (TYLENOL) 500 MG tablet Take 1,000 mg by mouth every 6 (six) hours as needed. Patient not taking: Reported on 12/24/2019    Emergency, Nurse, RN  cyclobenzaprine (FLEXERIL) 10 MG tablet Take 1 tablet (10 mg total) by mouth 2 (two) times daily as needed for muscle spasms. Patient not taking: Reported on 01/25/2018 02/04/15   Hedges, Tinnie Gens, PA-C  doxycycline (VIBRAMYCIN) 100 MG capsule Take 1 capsule (100 mg total) by mouth 2 (two) times daily. One po bid x 7 days Patient not taking: Reported on 12/24/2019 01/25/18   Bethann Berkshire, MD  HYDROcodone-acetaminophen (NORCO/VICODIN) 5-325 MG tablet Take 1-2 tablets by mouth every 6 hours as needed for pain and/or cough. Patient not taking: Reported on 01/25/2018 07/23/15   Pisciotta, Joni Reining, PA-C  hydrocortisone (ANUSOL-HC) 2.5 % rectal cream Apply rectally 2 times daily Patient not taking: Reported on 12/24/2019 09/13/18   Ward, Chase Picket, PA-C  ibuprofen (ADVIL,MOTRIN) 400 MG tablet Take 1 tablet (400 mg total) by mouth every 8 (eight) hours as needed for fever or headache. Patient not taking: Reported on 12/24/2019 01/23/18   Doreene Eland, MD  traMADol (ULTRAM) 50 MG tablet Take 1 every 6 hours for pain not helped  by Tylenol or Motrin Patient not taking: Reported on 12/24/2019 01/25/18   Bethann Berkshire, MD    Allergies    Patient has no known allergies.  Review of Systems   Review of Systems  Constitutional: Negative for chills, diaphoresis, fatigue and fever.  HENT: Negative for congestion, sore throat and trouble swallowing.   Eyes: Negative for pain and visual disturbance.  Respiratory: Negative for cough, shortness of breath and wheezing.   Cardiovascular: Positive for chest pain. Negative for palpitations and leg swelling.  Gastrointestinal: Negative for abdominal distention, abdominal pain, diarrhea, nausea and  vomiting.  Genitourinary: Negative for difficulty urinating.  Musculoskeletal: Negative for back pain, neck pain and neck stiffness.  Skin: Negative for pallor.  Neurological: Positive for numbness (L hand ). Negative for dizziness, speech difficulty, weakness and headaches.  Psychiatric/Behavioral: Negative for confusion.    Physical Exam Updated Vital Signs BP (!) 129/97 (BP Location: Right Arm)   Pulse (!) 57   Temp 98.2 F (36.8 C) (Oral)   Resp 12   SpO2 99%   Physical Exam Constitutional:      General: He is not in acute distress.    Appearance: Normal appearance. He is not ill-appearing, toxic-appearing or diaphoretic.  HENT:     Mouth/Throat:     Mouth: Mucous membranes are moist.     Pharynx: Oropharynx is clear.  Eyes:     General: No visual field deficit or scleral icterus.    Extraocular Movements: Extraocular movements intact.     Pupils: Pupils are equal, round, and reactive to light.  Cardiovascular:     Rate and Rhythm: Normal rate and regular rhythm.     Pulses: Normal pulses.     Heart sounds: Normal heart sounds.  Pulmonary:     Effort: Pulmonary effort is normal. No respiratory distress.     Breath sounds: Normal breath sounds. No stridor. No wheezing, rhonchi or rales.  Chest:     Chest wall: No tenderness.  Abdominal:     General: Abdomen is flat. There is no distension.     Palpations: Abdomen is soft.     Tenderness: There is no abdominal tenderness. There is no guarding or rebound.  Musculoskeletal:        General: No swelling or tenderness. Normal range of motion.     Cervical back: Normal range of motion and neck supple. No rigidity.     Right lower leg: No edema.     Left lower leg: No edema.  Skin:    General: Skin is warm and dry.     Capillary Refill: Capillary refill takes less than 2 seconds.     Coloration: Skin is not pale.  Neurological:     General: No focal deficit present.     Mental Status: He is alert and oriented to  person, place, and time.     Cranial Nerves: Cranial nerves are intact. No cranial nerve deficit or facial asymmetry.     Sensory: Sensation is intact.     Motor: Motor function is intact. No weakness or pronator drift.     Coordination: Coordination is intact. Coordination normal.     Gait: Gait is intact. Gait normal.     Comments: Sensation normal with sharp and dull testing on bilateral upper extremity. No abnormalities.   Psychiatric:        Mood and Affect: Mood normal.        Behavior: Behavior normal.     ED Results / Procedures /  Treatments   Labs (all labs ordered are listed, but only abnormal results are displayed) Labs Reviewed  CBC - Abnormal; Notable for the following components:      Result Value   WBC 3.6 (*)    RDW 11.0 (*)    All other components within normal limits  BASIC METABOLIC PANEL  TROPONIN I (HIGH SENSITIVITY)  TROPONIN I (HIGH SENSITIVITY)    EKG None  Radiology DG Chest 2 View  Result Date: 12/24/2019 CLINICAL DATA:  Chest pain and left arm pain 5 days. Shortness of breath. EXAM: CHEST - 2 VIEW COMPARISON:  01/09/2008 FINDINGS: The heart size and mediastinal contours are within normal limits. Both lungs are clear. The visualized skeletal structures are unremarkable. IMPRESSION: No active cardiopulmonary disease. Electronically Signed   By: Elberta Fortis M.D.   On: 12/24/2019 12:18    Procedures Procedures (including critical care time)  Medications Ordered in ED Medications  sodium chloride flush (NS) 0.9 % injection 3 mL (0 mLs Intravenous Hold 12/24/19 1212)    ED Course  I have reviewed the triage vital signs and the nursing notes.  Pertinent labs & imaging results that were available during my care of the patient were reviewed by me and considered in my medical decision making (see chart for details).    MDM Rules/Calculators/A&P                         Randall Dorsey is a 34 y.o. male with pertinent past medical history of asthma  that presents the emergency department today for chest pain. The emergent causes of chest pain include: Acute coronary syndrome, tamponade, pericarditis/myocarditis, aortic dissection, pulmonary embolism, tension pneumothorax, pneumonia, and esophageal rupture.  When patient was having pain with associated palpitations, I was able to assess him on the monitor, there were no ST elevations or arrhythmias noted on the monitor, I watched the monitor for about 5 minutes while sitting in the room. Initial interventions none, patient states that he has Tylenol at home and does not need any pain medications currently.  Dissection was considered however this is unlikely at this time due to pt's presentation and description of pain - patient is stable, pain is not tearing, with no radiation to pt's back. Pt has normal neurological exam and has equal strong pulses bilaterally. Pt denies any shortness of breath, leg pain, difficulty walking. Vitals are stable. Do not think he needs further evaluation for dissection at this time.    ECG interpreted by me demonstrated no signs of arrhythmia or ischemia.  CXR interpreted by me demonstrated no acute cardiopulmonary disease.  Labs demonstrated normal CBC and CMP, negative troponins. HEAR score 1.  Upon reassessment I shared lab findings with patient, he states that this is a big relief.  He states that he thinks that he could be having his chest pain due to the amount of caffeine he is drinking, states that he drinks about 5 cups a day.  He also states that he has been having a lot of anxiety with work in the past 2 weeks when the chest pain started.  Patient education on caffeine intake and anxiety were provided. Given the above findings, my suspicion is that patient has anxiety related chest pain.  Patient is to be discharged with recommendation to follow up with PCP in regards to today's hospital visit. Chest pain is not likely of cardiac or pulmonary etiology  d/t presentation, PERC negative, VSS, no tracheal deviation,  no JVD or new murmur, RRR, breath sounds equal bilaterally, EKG without acute abnormalities, negative troponin, and negative CXR. Pt has been advised to return to the ED if CP becomes exertional, associated with diaphoresis or nausea, radiates to left jaw/arm, worsens or becomes concerning in any way. Pt appears reliable for follow up and is agreeable to discharge. Expressed that if pt still has these symptoms after reducing caffeine and reducing stress to have PCP refer him to cardiologist, pt agreeable.     Final Clinical Impression(s) / ED Diagnoses Final diagnoses:  Anxiety  Chest pain, unspecified type    Rx / DC Orders ED Discharge Orders    None       Alfredia Client, PA-C 12/24/19 1733    Little, Wenda Overland, MD 12/25/19 [phone]

## 2019-12-24 NOTE — ED Triage Notes (Signed)
C/o intermittent pain to center of chest that radiates to L chest and to L arm x 2-3 days.

## 2019-12-24 NOTE — Discharge Instructions (Addendum)
You are seen today for chest pain, as we discussed this most likely due to anxiety.  I want you to follow-up with a primary care provider about this.  Your white count was 3.6, this was a little below normal, I want you to have this repeated when you see your primary care.  Use the guides attached.  .Read instructions below for reasons to return to the Emergency Department. It is recommended that your follow up with your Primary Care Doctor in regards to today's visit. If you do not have a doctor, use the resource guide listed below to help you find one. Begin taking over the counter Prilosec or Zegrid as directed.   Chest Pain (Nonspecific)  HOME CARE INSTRUCTIONS  For the next few days, avoid physical activities that bring on chest pain. Continue physical activities as directed.  Do not smoke cigarettes or drink alcohol until your symptoms are gone.  Only take over-the-counter or prescription medicine for pain, discomfort, or fever as directed by your caregiver.  Follow your caregiver's suggestions for further testing if your chest pain does not go away.  Keep any follow-up appointments you made. If you do not go to an appointment, you could develop lasting (chronic) problems with pain. If there is any problem keeping an appointment, you must call to reschedule.  SEEK MEDICAL CARE IF:  You think you are having problems from the medicine you are taking. Read your medicine instructions carefully.  Your chest pain does not go away, even after treatment.  You develop a rash with blisters on your chest.  SEEK IMMEDIATE MEDICAL CARE IF:  You have increased chest pain or pain that spreads to your arm, neck, jaw, back, or belly (abdomen).  You develop shortness of breath, an increasing cough, or you are coughing up blood.  You have severe back or abdominal pain, feel sick to your stomach (nauseous) or throw up (vomit).  You develop severe weakness, fainting, or chills.  You have an oral temperature  above 102 F (38.9 C), not controlled by medicine.   THIS IS AN EMERGENCY. Do not wait to see if the pain will go away. Get medical help at once. Call your local emergency services (911 in [place] Do not drive yourself to the ED.

## 2020-07-27 ENCOUNTER — Other Ambulatory Visit: Payer: Self-pay

## 2020-07-27 ENCOUNTER — Emergency Department (HOSPITAL_COMMUNITY)
Admission: EM | Admit: 2020-07-27 | Discharge: 2020-07-27 | Disposition: A | Discharge status: Home / Self Care | Payer: Self-pay | Attending: Emergency Medicine | Admitting: Emergency Medicine

## 2020-07-27 ENCOUNTER — Emergency Department (HOSPITAL_COMMUNITY): Payer: Self-pay

## 2020-07-27 ENCOUNTER — Encounter (HOSPITAL_COMMUNITY): Payer: Self-pay

## 2020-07-27 DIAGNOSIS — M5410 Radiculopathy, site unspecified: Secondary | ICD-10-CM

## 2020-07-27 DIAGNOSIS — M5412 Radiculopathy, cervical region: Secondary | ICD-10-CM | POA: Insufficient documentation

## 2020-07-27 DIAGNOSIS — J45909 Unspecified asthma, uncomplicated: Secondary | ICD-10-CM | POA: Insufficient documentation

## 2020-07-27 DIAGNOSIS — F17210 Nicotine dependence, cigarettes, uncomplicated: Secondary | ICD-10-CM | POA: Insufficient documentation

## 2020-07-27 LAB — CBC WITH DIFFERENTIAL/PLATELET
Abs Immature Granulocytes: 0 10*3/uL (ref 0.00–0.07)
Basophils Absolute: 0 10*3/uL (ref 0.0–0.1)
Basophils Relative: 1 %
Eosinophils Absolute: 0.2 10*3/uL (ref 0.0–0.5)
Eosinophils Relative: 5 %
HCT: 44.7 % (ref 39.0–52.0)
Hemoglobin: 15.5 g/dL (ref 13.0–17.0)
Immature Granulocytes: 0 %
Lymphocytes Relative: 39 %
Lymphs Abs: 1.6 10*3/uL (ref 0.7–4.0)
MCH: 33.5 pg (ref 26.0–34.0)
MCHC: 34.7 g/dL (ref 30.0–36.0)
MCV: 96.5 fL (ref 80.0–100.0)
Monocytes Absolute: 0.3 10*3/uL (ref 0.1–1.0)
Monocytes Relative: 7 %
Neutro Abs: 2 10*3/uL (ref 1.7–7.7)
Neutrophils Relative %: 48 %
Platelets: 278 10*3/uL (ref 150–400)
RBC: 4.63 MIL/uL (ref 4.22–5.81)
RDW: 11.1 % — ABNORMAL LOW (ref 11.5–15.5)
WBC: 4.1 10*3/uL (ref 4.0–10.5)
nRBC: 0 % (ref 0.0–0.2)

## 2020-07-27 LAB — COMPREHENSIVE METABOLIC PANEL
ALT: 15 U/L (ref 0–44)
AST: 25 U/L (ref 15–41)
Albumin: 4.5 g/dL (ref 3.5–5.0)
Alkaline Phosphatase: 52 U/L (ref 38–126)
Anion gap: 11 (ref 5–15)
BUN: 16 mg/dL (ref 6–20)
CO2: 26 mmol/L (ref 22–32)
Calcium: 9.8 mg/dL (ref 8.9–10.3)
Chloride: 102 mmol/L (ref 98–111)
Creatinine, Ser: 1.1 mg/dL (ref 0.61–1.24)
GFR, Estimated: >60 mL/min (ref >60)
Glucose, Bld: 83 mg/dL (ref 70–99)
Potassium: 3.8 mmol/L (ref 3.5–5.1)
Sodium: 139 mmol/L (ref 135–145)
Total Bilirubin: 0.6 mg/dL (ref 0.3–1.2)
Total Protein: 7.6 g/dL (ref 6.5–8.1)

## 2020-07-27 LAB — TROPONIN I (HIGH SENSITIVITY): Troponin I (High Sensitivity): <2 ng/L (ref <18)

## 2020-07-27 MED ORDER — PREDNISONE 10 MG (21) PO TBPK
ORAL_TABLET | Freq: Every day | ORAL | 0 refills | Status: DC
Start: 2020-07-27 — End: 2023-08-24

## 2020-07-27 NOTE — ED Provider Notes (Signed)
[facility]-EMERGENCY DEPT Provider Note   CSN: [id number] Arrival date & time: 07/27/20  1739     History Chief Complaint  Patient presents with  . Neck Pain  . arm numbness    Randall Dorsey is a 35 y.o. male.  HPI Patient is a 35 year old male who presents to the emergency department due to left-sided chest pain.  Symptoms started about 36 hours ago.  Reports tingling down the left upper extremity.  No numbness or weakness.  No shortness of breath, diaphoresis, nausea, or vomiting.  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  Also reports chronic neck issues and states that "he sleeps in bad positions".  Also reports multiple recent stressors in his life and feels anxiety due to this. No fevers, chills, URI sx.     History reviewed. No pertinent past medical history.  Patient Active Problem List   Diagnosis Date Noted  . ASTHMA 01/31/2010    Past Surgical History:  Procedure Laterality Date  . HERNIA REPAIR         Family History  Family history unknown: Yes    Social History   Tobacco Use  . Smoking status: Current Every Day Smoker    Packs/day: 0.15    Types: Cigarettes  . Smokeless tobacco: Never Used  Vaping Use  . Vaping Use: Never used  Substance Use Topics  . Alcohol use: Yes  . Drug use: Not Currently    Home Medications Prior to Admission medications   Medication Sig Start Date End Date Taking? Authorizing Provider  predniSONE (STERAPRED UNI-PAK 21 TAB) 10 MG (21) TBPK tablet Take by mouth daily. Take 6 tabs by mouth daily  for 2 days, then 5 tabs for 2 days, then 4 tabs for 2 days, then 3 tabs for 2 days, 2 tabs for 2 days, then 1 tab by mouth daily for 2 days 07/27/20  Yes Placido Sou, PA-C  acetaminophen (TYLENOL) 500 MG tablet Take 1,000 mg by mouth every 6 (six) hours as needed. Patient not taking: Reported on 12/24/2019    Emergency, Nurse, RN  cyclobenzaprine (FLEXERIL) 10 MG tablet Take 1 tablet  (10 mg total) by mouth 2 (two) times daily as needed for muscle spasms. Patient not taking: Reported on 01/25/2018 02/04/15   Hedges, Tinnie Gens, PA-C  doxycycline (VIBRAMYCIN) 100 MG capsule Take 1 capsule (100 mg total) by mouth 2 (two) times daily. One po bid x 7 days Patient not taking: Reported on 12/24/2019 01/25/18   Bethann Berkshire, MD  HYDROcodone-acetaminophen (NORCO/VICODIN) 5-325 MG tablet Take 1-2 tablets by mouth every 6 hours as needed for pain and/or cough. Patient not taking: Reported on 01/25/2018 07/23/15   Pisciotta, Joni Reining, PA-C  hydrocortisone (ANUSOL-HC) 2.5 % rectal cream Apply rectally 2 times daily Patient not taking: Reported on 12/24/2019 09/13/18   Ward, Chase Picket, PA-C  ibuprofen (ADVIL,MOTRIN) 400 MG tablet Take 1 tablet (400 mg total) by mouth every 8 (eight) hours as needed for fever or headache. Patient not taking: Reported on 12/24/2019 01/23/18   Doreene Eland, MD  traMADol Janean Sark) 50 MG tablet Take 1 every 6 hours for pain not helped by Tylenol or Motrin Patient not taking: Reported on 12/24/2019 01/25/18   Bethann Berkshire, MD    Allergies    Patient has no known allergies.  Review of Systems   Review of Systems  All other systems reviewed and are negative. Ten systems reviewed and are negative for acute change, except as  noted in the HPI.   Physical Exam Updated Vital Signs BP 124/89 (BP Location: Left Arm)   Pulse 68   Temp 98.1 F (36.7 C) (Oral)   Resp 16   Ht 5\' 5"  (1.651 m)   Wt 68 kg   SpO2 99%   BMI 24.96 kg/m   Physical Exam Vitals and nursing note reviewed.  Constitutional:      General: He is not in acute distress.    Appearance: Normal appearance. He is normal weight. He is not ill-appearing, toxic-appearing or diaphoretic.  HENT:     Head: Normocephalic and atraumatic.     Right Ear: External ear normal.     Left Ear: External ear normal.     Nose: Nose normal.     Mouth/Throat:     Mouth: Mucous membranes are moist.     Pharynx:  Oropharynx is clear. No oropharyngeal exudate or posterior oropharyngeal erythema.  Eyes:     Extraocular Movements: Extraocular movements intact.  Cardiovascular:     Rate and Rhythm: Normal rate and regular rhythm.     Pulses: Normal pulses.     Heart sounds: Normal heart sounds. No murmur heard. No friction rub. No gallop.   Pulmonary:     Effort: Pulmonary effort is normal. No respiratory distress.     Breath sounds: Normal breath sounds. No stridor. No wheezing, rhonchi or rales.  Abdominal:     General: Abdomen is flat.     Palpations: Abdomen is soft.     Tenderness: There is no abdominal tenderness.  Musculoskeletal:        General: No tenderness. Normal range of motion.     Cervical back: Normal range of motion and neck supple. No tenderness.     Comments: No tenderness appreciated along the chest or left upper extremity.  Distal sensation intact.  2+ radial pulses noted bilaterally.  Good cap refill.    Skin:    General: Skin is warm and dry.  Neurological:     General: No focal deficit present.     Mental Status: He is alert and oriented to person, place, and time.     Comments: Strength is 5 out of 5 in the bilateral upper extremities.  No gross deficits.  Psychiatric:        Mood and Affect: Mood normal.        Behavior: Behavior normal.    ED Results / Procedures / Treatments   Labs (all labs ordered are listed, but only abnormal results are displayed) Labs Reviewed  CBC WITH DIFFERENTIAL/PLATELET - Abnormal; Notable for the following components:      Result Value   RDW 11.1 (*)    All other components within normal limits  COMPREHENSIVE METABOLIC PANEL  TROPONIN I (HIGH SENSITIVITY)    EKG EKG Interpretation  Date/Time:  Friday July 27 2020 18:21:27 EST Ventricular Rate:  65 PR Interval:    QRS Duration: 85 QT Interval:  377 QTC Calculation: 392 R Axis:   80 Text Interpretation: Sinus rhythm ST elev, probable normal early repol pattern Baseline  wander in lead(s) II III aVF 12 Lead; Mason-Likar No STEMI Confirmed by [id number] [phone]) on 07/27/2020 6:39:50 PM   Radiology DG Chest Portable 1 View  Result Date: 07/27/2020 CLINICAL DATA:  Chest pain EXAM: PORTABLE CHEST 1 VIEW COMPARISON:  12/24/2019 FINDINGS: The heart size and mediastinal contours are within normal limits. Both lungs are clear. The visualized skeletal structures are unremarkable. IMPRESSION: No active disease. Electronically  Signed   By: Jasmine Pang M.D.   On: 07/27/2020 18:45    Procedures Procedures (including critical care time)  Medications Ordered in ED Medications - No data to display  ED Course  I have reviewed the triage vital signs and the nursing notes.  Pertinent labs & imaging results that were available during my care of the patient were reviewed by me and considered in my medical decision making (see chart for details).  Clinical Course as of 07/27/20 2054  Fri Jul 27, 2020  1918 35 yo male presenting to ED with left sided chest pain and left posterior upper arm paresthesias since yesterday.  Similar episode 3-4 months ago that resolved on own.  No hx of angina.  Only cardiac risk factor per HEART score is smoking - no sig fam hx, no HTN, HLD, diabetes.  He is comfortable on exam, lungs CTAB.  CP not reproducible.  ECG shows NSR with BER - doubt ACS.  Xray clear with on acute findings.  Labs and trop pending.  If negative anticipate discharge home with treatment with NSAIDS for neuropathy - suspect peripheral neuropathy.  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  Otherwise doubt PE, sepsis, PNA at this time.  PERC negative. [MT]    Clinical Course User Index [MT] Terald Sleeper, MD   MDM Rules/Calculators/A&P                          Pt is a 35 y.o. male who presents the emergency department with left-sided chest pain for the past 36 hours as well as tingling radiating down the  left upper extremity.  Labs: CBC with differential is reassuring.  No leukocytosis.  No neutrophilia. CMP benign. Troponin less than 2.  Imaging: Chest x-ray is negative.  ECG: Sinus rhythm ST elev, probable normal early repol pattern Baseline wander in lead(s) II III aVF 12 Lead; Mason-Likar No STEMI  I, Placido Sou, PA-C, personally reviewed and evaluated these images, ecg and lab results as part of my medical decision-making.  Feel that patient's symptoms are likely secondary to radiculopathy.  Given his findings above doubt ACS.  PERC negative.  Patient wears a heavy bulletproof vest throughout the day and also sleeps in awkward positions on his left arm.  Recommended use of ibuprofen for his symptoms.  We discussed dosing.  We will also give a prescription for a prednisone taper.  Patient is not insured and does not have a PCP.  We will give follow-up information with [facility].  Discussed return precautions.  His questions were answered and he was amicable at the time of discharge.  Note: Portions of this report may have been transcribed using voice recognition software. Every effort was made to ensure accuracy; however, inadvertent computerized transcription errors may be present.   Final Clinical Impression(s) / ED Diagnoses Final diagnoses:  Radiculopathy, unspecified spinal region    Rx / DC Orders ED Discharge Orders         Ordered    predniSONE (STERAPRED UNI-PAK 21 TAB) 10 MG (21) TBPK tablet  Daily        07/27/20 2053           Placido Sou, PA-C 07/27/20 2057    Terald Sleeper, MD 07/27/20 2218

## 2020-07-27 NOTE — ED Triage Notes (Addendum)
Patient reports that he has posterior neck pain/spasm and left arm numbness and pain x 36 hours. Paitenat denies any injury or trauma. Patient also reports pain from neck radiates into the left shoulder.

## 2020-07-27 NOTE — Discharge Instructions (Addendum)
Like we discussed, try taking ibuprofen for management of your symptoms.  I would recommend taking 800 mg 2x times per day.  A regular ibuprofen is 200 mg.  Also try taking this with a small amount of food as ibuprofen can upset the stomach.  Do this for 1 to 2 weeks.  See if this helps with your symptoms.  I have also prescribed you a prednisone taper.  This is a steroid medication.  Try taking this earlier in the morning because it can be a stimulating medication and make it difficult to sleep at night. If you find that you are not getting relief with the ibuprofen, begin taking this medication.  If your symptoms worsen, you can always return to the emergency department.  I have given you follow-up information below for [facility].  They can act as a primary care provider for people that are uninsured in the [place] area.  Please give them a call and schedule a follow-up appointment.  It was a pleasure to meet you.

## 2022-03-26 IMAGING — DX DG CHEST 1V PORT
1 series · 1 of 1 positions shown · non-contrast
Comparison: 12/24/2019

CLINICAL DATA: Chest pain

EXAM:
PORTABLE CHEST 1 VIEW

[chest ap]
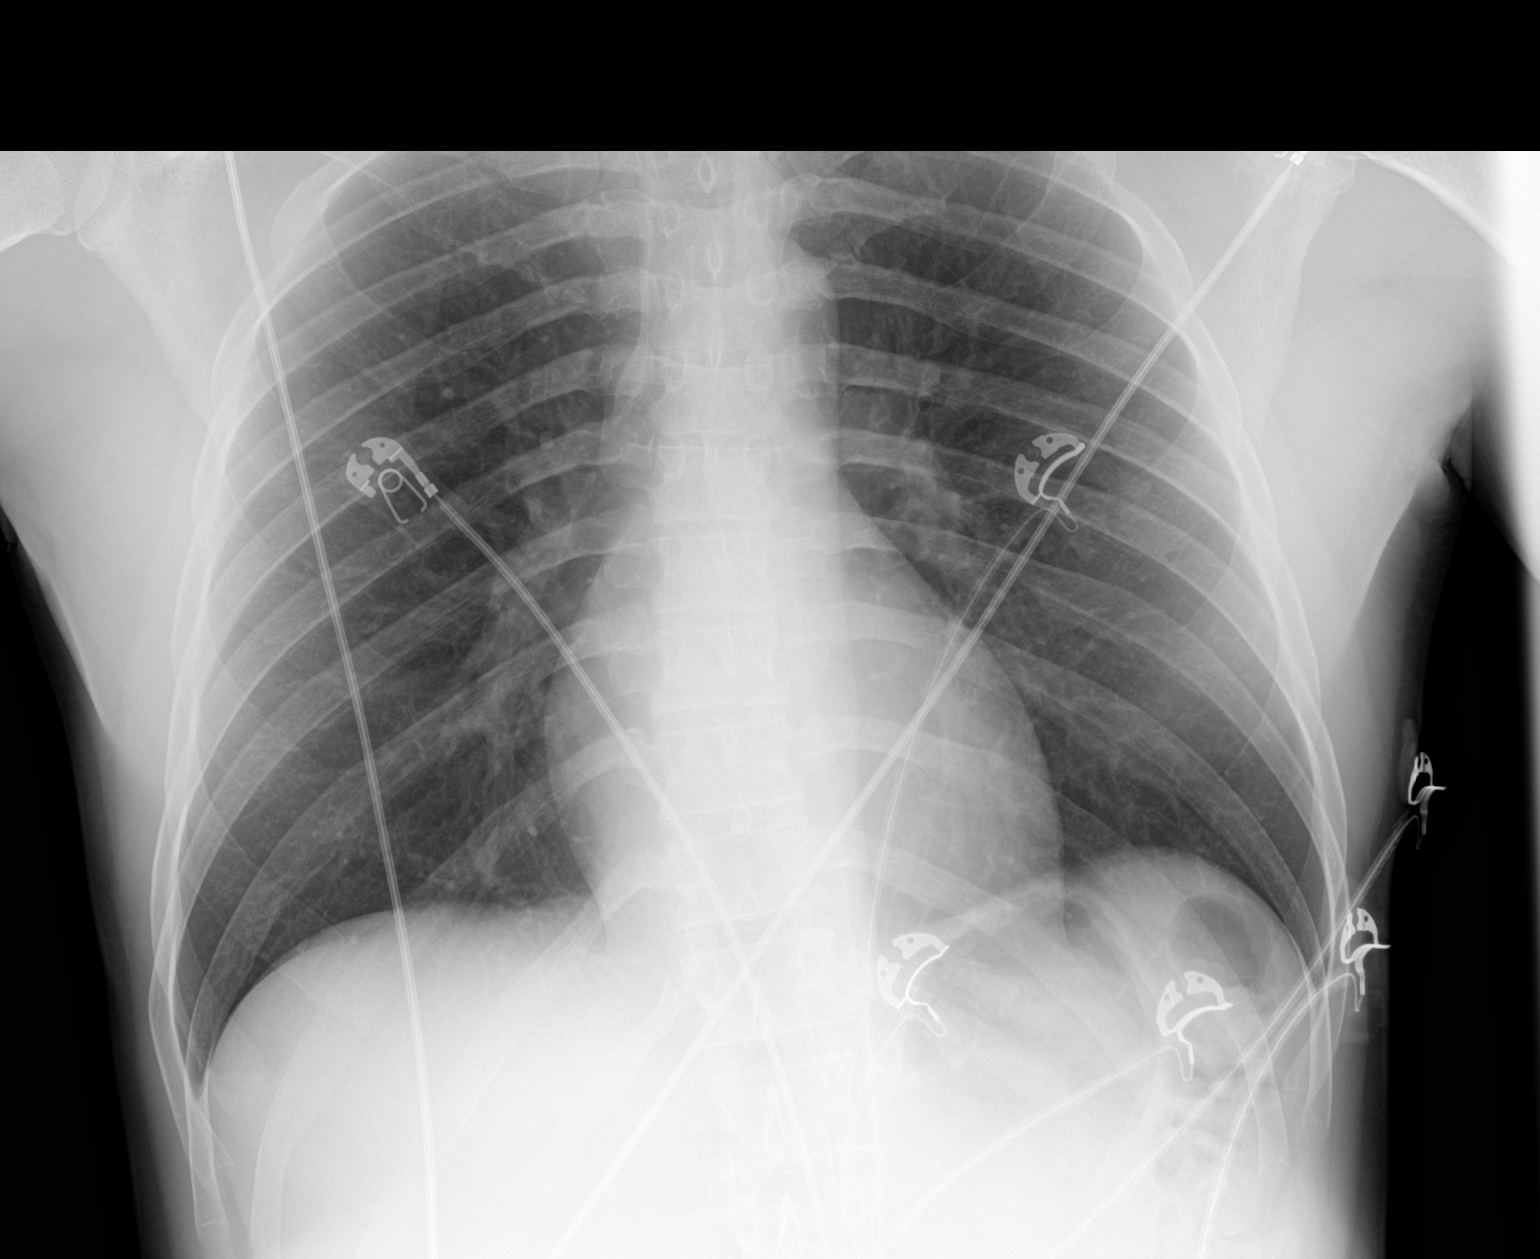

[1 of 1 positions shown; findings below may reference images not displayed]

FINDINGS: The heart size and mediastinal contours are within normal limits.
Both lungs are clear. The visualized skeletal structures are
unremarkable.
IMPRESSION: No active disease.

## 2023-08-24 ENCOUNTER — Other Ambulatory Visit: Payer: Self-pay

## 2023-08-24 ENCOUNTER — Emergency Department (HOSPITAL_BASED_OUTPATIENT_CLINIC_OR_DEPARTMENT_OTHER): Payer: Self-pay

## 2023-08-24 ENCOUNTER — Encounter (HOSPITAL_BASED_OUTPATIENT_CLINIC_OR_DEPARTMENT_OTHER): Payer: Self-pay | Admitting: *Deleted

## 2023-08-24 ENCOUNTER — Emergency Department (HOSPITAL_BASED_OUTPATIENT_CLINIC_OR_DEPARTMENT_OTHER)
Admission: EM | Admit: 2023-08-24 | Discharge: 2023-08-24 | Disposition: A | Discharge status: Home / Self Care | Payer: Self-pay | Attending: Emergency Medicine | Admitting: Emergency Medicine

## 2023-08-24 DIAGNOSIS — J45909 Unspecified asthma, uncomplicated: Secondary | ICD-10-CM | POA: Insufficient documentation

## 2023-08-24 DIAGNOSIS — R202 Paresthesia of skin: Secondary | ICD-10-CM | POA: Insufficient documentation

## 2023-08-24 DIAGNOSIS — R079 Chest pain, unspecified: Secondary | ICD-10-CM | POA: Insufficient documentation

## 2023-08-24 HISTORY — DX: Unspecified asthma, uncomplicated: J45.909

## 2023-08-24 LAB — TROPONIN I (HIGH SENSITIVITY): Troponin I (High Sensitivity): 5 ng/L (ref <18)

## 2023-08-24 LAB — BASIC METABOLIC PANEL
Anion gap: 5 (ref 5–15)
BUN: 11 mg/dL (ref 6–20)
CO2: 29 mmol/L (ref 22–32)
Calcium: 9.5 mg/dL (ref 8.9–10.3)
Chloride: 104 mmol/L (ref 98–111)
Creatinine, Ser: 0.97 mg/dL (ref 0.61–1.24)
GFR, Estimated: >60 mL/min (ref >60)
Glucose, Bld: 88 mg/dL (ref 70–99)
Potassium: 4.1 mmol/L (ref 3.5–5.1)
Sodium: 138 mmol/L (ref 135–145)

## 2023-08-24 LAB — CBC
HCT: 41.3 % (ref 39.0–52.0)
Hemoglobin: 14.3 g/dL (ref 13.0–17.0)
MCH: 33.1 pg (ref 26.0–34.0)
MCHC: 34.6 g/dL (ref 30.0–36.0)
MCV: 95.6 fL (ref 80.0–100.0)
Platelets: 269 10*3/uL (ref 150–400)
RBC: 4.32 MIL/uL (ref 4.22–5.81)
RDW: 11.1 % — ABNORMAL LOW (ref 11.5–15.5)
WBC: 4.4 10*3/uL (ref 4.0–10.5)
nRBC: 0 % (ref 0.0–0.2)

## 2023-08-24 LAB — RESP PANEL BY RT-PCR (RSV, FLU A&B, COVID)  RVPGX2
Influenza A by PCR: NEGATIVE
Influenza B by PCR: NEGATIVE
Resp Syncytial Virus by PCR: NEGATIVE
SARS Coronavirus 2 by RT PCR: NEGATIVE

## 2023-08-24 MED ORDER — KETOROLAC TROMETHAMINE 15 MG/ML IJ SOLN
15.0000 mg | Freq: Once | INTRAMUSCULAR | Status: AC
  Administered 2023-08-24: 15 mg via INTRAVENOUS
  Filled 2023-08-24: qty 1

## 2023-08-24 MED ORDER — LIDOCAINE 5 % EX PTCH
1.0000 | MEDICATED_PATCH | CUTANEOUS | Status: DC
  Administered 2023-08-24: 1 via TRANSDERMAL
  Filled 2023-08-24: qty 1

## 2023-08-24 NOTE — Discharge Instructions (Addendum)
Your EKG, CXR, and screening labs were all reassuring. Please follow-up outpatient with a primary care physician for further diagnostic testing as needed. Return for any severe worsening symptoms. Covid/Flu/RSV was also negative.

## 2023-08-24 NOTE — ED Triage Notes (Addendum)
C/o anterior chest pain that started 4 days ago. States pain worse today. Describes as numbness down his left arm. C/o tightness in his neck area. Sob when the pain increases. Denies n/v. Denies any heart history. Denies any recent illness. Took tylenol 1.5 hours ago.

## 2023-08-24 NOTE — ED Notes (Signed)
 Pt on monitor

## 2023-08-24 NOTE — ED Notes (Signed)

## 2023-08-24 NOTE — ED Provider Notes (Signed)
[place] EMERGENCY DEPARTMENT AT [facility] Provider Note   CSN: [id number] Arrival date & time: 08/24/23  7829     History  Chief Complaint  Patient presents with   Chest Pain    Randall Dorsey is a 38 y.o. male.   Chest Pain    38 year old male with past medical history significant for asthma who presents emergency department with chest pain.  The patient states that he has had left-sided anterior chest pressure.  He also endorses some tightness in his neck with associated paresthesias and occasional numbness radiating down his left arm.  Symptoms been ongoing for the past 4 days.  He denies any cough, denies any fever or chills.  He endorses mild shortness of breath when he notices the symptoms.  No nausea or vomiting.  No lower extremity swelling.  No history of DVT or PE.  No known sick contacts.  No recent illness.  No significant aggravating or alleviating factors, not exertional.  No lower extremity swelling, recent long travel.  No focal weakness in any of his extremities.   Home Medications Prior to Admission medications   Not on File      Allergies    Patient has no known allergies.    Review of Systems   Review of Systems  Cardiovascular:  Positive for chest pain.  All other systems reviewed and are negative.   Physical Exam Updated Vital Signs BP 122/89   Pulse (!) 57   Temp 97.9 F (36.6 C) (Oral)   Resp 10   Ht 5\' 4"  (1.626 m)   Wt 68 kg   SpO2 100%   BMI 25.75 kg/m  Physical Exam Vitals and nursing note reviewed.  Constitutional:      General: He is not in acute distress.    Appearance: He is well-developed.  HENT:     Head: Normocephalic and atraumatic.  Eyes:     Conjunctiva/sclera: Conjunctivae normal.  Neck:     Comments: No midline TTP, negative Spurling's sign Cardiovascular:     Rate and Rhythm: Normal rate and regular rhythm.     Pulses: Normal pulses.     Heart sounds: No murmur heard.    Comments: 2+ radial pulses  bilaterally Pulmonary:     Effort: Pulmonary effort is normal. No respiratory distress.     Breath sounds: Normal breath sounds.  Chest:     Comments: Slight left sided chest wall tenderness to palpation Abdominal:     Palpations: Abdomen is soft.     Tenderness: There is no abdominal tenderness.  Musculoskeletal:        General: No swelling.     Cervical back: Neck supple.  Skin:    General: Skin is warm and dry.     Capillary Refill: Capillary refill takes less than 2 seconds.  Neurological:     Mental Status: He is alert.     Comments: MENTAL STATUS EXAM:    Orientation: Alert and oriented to person, place and time.  Memory: Cooperative, follows commands well.  Language: Speech is clear and language is normal.   CRANIAL NERVES:    CN 2 (Optic): Visual fields intact to confrontation.  CN 3,4,6 (EOM): Pupils equal and reactive to light. Full extraocular eye movement without nystagmus.  CN 5 (Trigeminal): Facial sensation is normal, no weakness of masticatory muscles.  CN 7 (Facial): No facial weakness or asymmetry.  CN 8 (Auditory): Auditory acuity grossly normal.  CN 9,10 (Glossophar): The uvula is midline, the  palate elevates symmetrically.  CN 11 (spinal access): Normal sternocleidomastoid and trapezius strength.  CN 12 (Hypoglossal): The tongue is midline. No atrophy or fasciculations.Marland Kitchen   MOTOR:  Muscle Strength: 5/5RUE, 5/5LUE, 5/5RLE, 5/5LLE.   COORDINATION:   No tremor.   SENSATION:   Intact to light touch all four extremities.  GAIT: Gait normal without ataxia   Psychiatric:        Mood and Affect: Mood normal.     ED Results / Procedures / Treatments   Labs (all labs ordered are listed, but only abnormal results are displayed) Labs Reviewed  CBC - Abnormal; Notable for the following components:      Result Value   RDW 11.1 (*)    All other components within normal limits  RESP PANEL BY RT-PCR (RSV, FLU A&B, COVID)  [id number]  BASIC METABOLIC PANEL   TROPONIN I (HIGH SENSITIVITY)    EKG EKG Interpretation Date/Time:  Monday August 24 2023 06:50:01 EST Ventricular Rate:  60 PR Interval:  192 QRS Duration:  89 QT Interval:  414 QTC Calculation: 400 R Axis:   85  Text Interpretation: Sinus rhythm ST elev, probable normal early repol pattern No significant change since last tracing Confirmed by Ernie Avena ([id number]) on 08/24/2023 6:54:29 AM  Radiology DG Chest Port 1 View Result Date: 08/24/2023 CLINICAL DATA:  Chest pain EXAM: PORTABLE CHEST 1 VIEW COMPARISON:  07/27/2020 FINDINGS: The lungs are clear without focal pneumonia, edema, pneumothorax or pleural effusion. The cardiopericardial silhouette is within normal limits for size. No acute bony abnormality. Telemetry leads overlie the chest. IMPRESSION: No active disease. Electronically Signed   By: Kennith Center M.D.   On: 08/24/2023 07:32    Procedures Procedures    Medications Ordered in ED Medications - No data to display  ED Course/ Medical Decision Making/ A&P             HEART Score: 1                    Medical Decision Making  38 year old male with past medical history significant for asthma who presents emergency department with chest pain.  The patient states that he has had left-sided anterior chest pressure.  He also endorses some tightness in his neck with associated paresthesias and occasional numbness radiating down his left arm.  Symptoms been ongoing for the past 4 days.  He denies any cough, denies any fever or chills.  He endorses mild shortness of breath when he notices the symptoms.  No nausea or vomiting.  No lower extremity swelling.  No history of DVT or PE.  No known sick contacts.  No recent illness.  No significant aggravating or alleviating factors, not exertional.  No lower extremity swelling, recent long travel.  No focal weakness in any of his extremities.   Medical Decision Making: Randall Dorsey is a 38 y.o. male who presented to the ED today  with chest pain, detailed above.  Based on patient's comorbidities, patient has a heart score of 1.    Patient placed on continuous vitals and telemetry monitoring while in ED which was reviewed periodically.  Complete initial physical exam performed, notably the patient was mildly tender to palpation along the left chest wall.   Reviewed and confirmed nursing documentation for past medical history, family history, social history.    Initial Assessment: With the patient's presentation of left-sided chest pain, most likely diagnosis is musculoskeletal chest pain versus GERD, although ACS remains on the differential.  Other diagnoses were considered including (but not limited to) pulmonary embolism, community-acquired pneumonia, aortic dissection, pneumothorax, underlying bony abnormality, anemia. These are considered less likely due to history of present illness and physical exam findings.    In particular, concerning pulmonary embolism: Patient is PERC negative and the they deny malignancy, recent surgery, history of DVT, or calf tenderness leading to a low risk Wells score. Aortic Dissection also considered but seems less likely based on the location, quality, onset, and severity of symptoms in this case.   Initial Plan: Evaluate for ACS with single troponin and EKG evaluated as below  Evaluate for dissection, bony abnormality, or pneumonia with chest x-ray and screening laboratory evaluation including CBC, BMP  Further evaluation for pulmonary embolism not indicated at this time based on patient's PERC and Wells score.  Further evaluation for Thoracic Aortic Dissection not indicated at this time based on patient's clinical history and PE findings.   Initial Study Results: EKG was reviewed independently. Rate, rhythm, axis, intervals all examined and without medically relevant abnormality. ST segments without concerns for elevations, early repolarization present similar to previous EKGs.     Laboratory  Single troponin demonstrated normal values  Covid/Flu/RSV PCR testing was negative.   CBC and BMP without obvious metabolic or inflammatory abnormalities requiring further evaluation   Radiology  DG Chest Port 1 View Result Date: 08/24/2023 CLINICAL DATA:  Chest pain EXAM: PORTABLE CHEST 1 VIEW COMPARISON:  07/27/2020 FINDINGS: The lungs are clear without focal pneumonia, edema, pneumothorax or pleural effusion. The cardiopericardial silhouette is within normal limits for size. No acute bony abnormality. Telemetry leads overlie the chest. IMPRESSION: No active disease. Electronically Signed   By: Kennith Center M.D.   On: 08/24/2023 07:32    Final Assessment and Plan: Patient with reassuring EKG, chest x-ray, laboratory evaluation.  Some reproducible left-sided tenderness to palpation.  Nonspecific symptoms, overall reassuring workup at this time.  Symptoms are ongoing for the past 4 days, do not think repeat cardiac troponin is indicated at this time.  Recommended outpatient follow-up with his PCP for consideration for outpatient cardiac stress testing.  The patient has been appropriately medically screened and/or stabilized in the ED. I have low suspicion for any other emergent medical condition which would require further screening, evaluation or treatment in the ED or require inpatient management.  Return precautions provided for any severe worsening symptoms, suspect anxiety as the etiology of the patient's paresthesias.  He is neurologically intact at this time without deficit, negative Spurling sign.  Stable for outpatient follow-up.   Final Clinical Impression(s) / ED Diagnoses Final diagnoses:  Chest pain, unspecified type  Paresthesias    Rx / DC Orders ED Discharge Orders     None         Ernie Avena, MD 08/24/23 0800
